# Patient Record
Sex: Female | Born: 1990 | Race: White | Hispanic: No | Marital: Single | Smoking: Current every day smoker
Health system: Southern US, Community
[De-identification: ages and names within clinical notes are randomized; demographics above are authoritative.]

## PROBLEM LIST (undated history)

## (undated) DIAGNOSIS — F419 Anxiety disorder, unspecified: Secondary | ICD-10-CM

## (undated) DIAGNOSIS — F1110 Opioid abuse, uncomplicated: Secondary | ICD-10-CM

## (undated) DIAGNOSIS — F329 Major depressive disorder, single episode, unspecified: Secondary | ICD-10-CM

## (undated) DIAGNOSIS — F32A Depression, unspecified: Secondary | ICD-10-CM

## (undated) HISTORY — PX: NO PAST SURGERIES: SHX2092

---

## 2012-06-03 ENCOUNTER — Emergency Department (HOSPITAL_COMMUNITY)
Admission: EM | Admit: 2012-06-03 | Discharge: 2012-06-03 | Disposition: A | Discharge status: Home / Self Care | Payer: Self-pay | Attending: Emergency Medicine | Admitting: Emergency Medicine

## 2012-06-03 ENCOUNTER — Encounter (HOSPITAL_COMMUNITY): Payer: Self-pay | Admitting: *Deleted

## 2012-06-03 DIAGNOSIS — F3289 Other specified depressive episodes: Secondary | ICD-10-CM | POA: Insufficient documentation

## 2012-06-03 DIAGNOSIS — F1120 Opioid dependence, uncomplicated: Secondary | ICD-10-CM | POA: Insufficient documentation

## 2012-06-03 DIAGNOSIS — F411 Generalized anxiety disorder: Secondary | ICD-10-CM | POA: Insufficient documentation

## 2012-06-03 DIAGNOSIS — F17200 Nicotine dependence, unspecified, uncomplicated: Secondary | ICD-10-CM | POA: Insufficient documentation

## 2012-06-03 DIAGNOSIS — F1910 Other psychoactive substance abuse, uncomplicated: Secondary | ICD-10-CM

## 2012-06-03 DIAGNOSIS — F329 Major depressive disorder, single episode, unspecified: Secondary | ICD-10-CM | POA: Insufficient documentation

## 2012-06-03 HISTORY — DX: Anxiety disorder, unspecified: F41.9

## 2012-06-03 HISTORY — DX: Depression, unspecified: F32.A

## 2012-06-03 HISTORY — DX: Major depressive disorder, single episode, unspecified: F32.9

## 2012-06-03 LAB — BASIC METABOLIC PANEL
BUN: 15 mg/dL (ref 6–23)
Chloride: 104 mEq/L (ref 96–112)
GFR calc Af Amer: >90 mL/min (ref >90)
GFR calc non Af Amer: >90 mL/min (ref >90)
Glucose, Bld: 93 mg/dL (ref 70–99)
Potassium: 4.1 mEq/L (ref 3.5–5.1)
Sodium: 141 mEq/L (ref 135–145)

## 2012-06-03 LAB — PREGNANCY, URINE: Preg Test, Ur: NEGATIVE

## 2012-06-03 LAB — CBC WITH DIFFERENTIAL/PLATELET
Eosinophils Absolute: 0.1 10*3/uL (ref 0.0–0.7)
Hemoglobin: 12 g/dL (ref 12.0–15.0)
Lymphs Abs: 2.4 10*3/uL (ref 0.7–4.0)
Monocytes Relative: 12 % (ref 3–12)
Neutro Abs: 2 10*3/uL (ref 1.7–7.7)
Neutrophils Relative %: 39 % — ABNORMAL LOW (ref 43–77)
Platelets: 166 10*3/uL (ref 150–400)
RBC: 4.33 MIL/uL (ref 3.87–5.11)
WBC: 5.2 10*3/uL (ref 4.0–10.5)

## 2012-06-03 LAB — RAPID URINE DRUG SCREEN, HOSP PERFORMED
Barbiturates: NOT DETECTED
Tetrahydrocannabinol: NOT DETECTED

## 2012-06-03 MED ORDER — PROMETHAZINE HCL 25 MG/ML IJ SOLN
25.0000 mg | Freq: Once | INTRAMUSCULAR | Status: AC
  Administered 2012-06-03: 25 mg via INTRAMUSCULAR
  Filled 2012-06-03: qty 1

## 2012-06-03 MED ORDER — ACETAMINOPHEN 325 MG PO TABS
650.0000 mg | ORAL_TABLET | Freq: Once | ORAL | Status: AC
  Administered 2012-06-03: 650 mg via ORAL
  Filled 2012-06-03: qty 2

## 2012-06-03 MED ORDER — NICOTINE 21 MG/24HR TD PT24
21.0000 mg | MEDICATED_PATCH | Freq: Once | TRANSDERMAL | Status: DC
  Administered 2012-06-03: 21 mg via TRANSDERMAL
  Filled 2012-06-03: qty 1

## 2012-06-03 MED ORDER — LORAZEPAM 1 MG PO TABS
0.5000 mg | ORAL_TABLET | Freq: Once | ORAL | Status: AC
  Administered 2012-06-03: 0.5 mg via ORAL
  Filled 2012-06-03: qty 1

## 2012-06-03 MED ORDER — NICOTINE 14 MG/24HR TD PT24: 14.0000 mg | MEDICATED_PATCH | Freq: Once | TRANSDERMAL | Status: DC

## 2012-06-03 NOTE — ED Notes (Signed)
Pt still in waiting area awaiting placement

## 2012-06-03 NOTE — BH Assessment (Signed)
[facility] Assessment Progress Note      06/03/2012 Arrived at the E.D. After the patient had already been discharged. Received a call stating that patient had not received the authorization from [facility] and if they didn't have it, then she would be returned back to ED. Contacted [facility], spoke with Thayer Ohm and gave clinical information. He authorized 3 days, authorization number [id number]. Contacted Behavior[facility] and faxed the support paperwork showing that authorization was completed for patient.   Shon Baton, MSW, LCSW, LCASA, CSW-G

## 2012-06-03 NOTE — BH Assessment (Signed)
Assessment Note   Kathy Pearson is an 21 y.o. female. PT WAS BROUGHT IN BY MOM WITH BOYFRIEND SEEKING DETOX FROM HEROINE. PT EXPRESSED THAT HE HAS BEEN USING 1 TO 2 GRAMS  OF HEROIN DAILY FOR THE PAST 1 YR. PT SAYS HE HAS LAST USE WAS LAST NIGHT & DOES NOT WANT TO CONTINUE LIVING THIS WAY. PT SAYS SHE WANTS TO GET HER LIFE RIGHT FOR HER CHILDREN. PT DENIES ANY IDEATION AT THIS TIME & IS ABLE TO CONTRACT FOR SAFETY. PT ADMITS TO EXPERIENCING SOME WITHDRAWAL SYMPTOMS. PT HAS BEEN INFORMED THAT SHE & BF COULD NOT GOT TO THE SAME FACILITY FOR TREATMENT & PT UNDERSTANDS TERMS. PT HAS REQUESTED TO GO TO [facility] FOR TX. PT WILL BE REFERRED TO [facility] OR [facility] ONCE CLEARED & IS CURRENTLY PENDING DISPOSITION.  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 Depression   . Anxiety    Axis IV: economic problems, housing problems, occupational problems, other psychosocial or environmental problems and problems related to social environment Axis V: 31-40 impairment in reality testing  Past Medical History:  Past Medical History  Diagnosis Date  . Depression   . Anxiety     History reviewed. No pertinent past surgical history.  Family History: No family history on file.  Social History:  reports that she has been smoking Cigarettes.  She does not have any smokeless tobacco history on file. She reports that she uses illicit drugs. She reports that she does not drink alcohol.  Additional Social History:  Alcohol / Drug Use History of alcohol / drug use?: Yes Substance #1 Name of Substance 1: HEROINE 1 - Age of First Use: 20 1 - Amount (size/oz):  1 - 2 GRAM 1 - Frequency: DAILY 1 - Duration: ON GOING 1 - Last Use / Amount: 06/02/12  CIWA: CIWA-Ar BP: 112/81 mmHg Pulse Rate: 90  COWS: Clinical Opiate Withdrawal Scale (COWS) Resting Pulse Rate: Pulse Rate 80 or below Sweating: Subjective report of chills or  flushing Restlessness: Reports difficulty sitting still, but is able to do so Pupil Size: Pupils pinned or normal size for room light Bone or Joint Aches: Patient reports sever diffuse aching of joints/muscles Runny Nose or Tearing: Not present GI Upset: nausea or loose stool Tremor: Tremor can be felt, but not observed Yawning: Yawning once or twice during assessment Anxiety or Irritability: Patient reports increasing irritability or anxiousness Gooseflesh Skin: Piloerection of skin can be felt or hairs standing up on arms COWS Total Score: 12   Allergies:  Allergies  Allergen Reactions  . Clindamycin/Lincomycin Anaphylaxis  . Ultram (Tramadol) Anaphylaxis    Home Medications:  (Not in a hospital admission)  OB/GYN Status:  Patient's last menstrual period was 05/27/2012.  General Assessment Data Location of Assessment: [facility] Assessment: Yes Living Arrangements: Other (Comment) (HOMELESS) Can pt return to current living arrangement?: Yes Admission Status: Voluntary Is patient capable of signing voluntary admission?: Yes Transfer from: Acute Hospital Referral Source: MD     Risk to self Suicidal Ideation: No Suicidal Intent: No Is patient at risk for suicide?: No Suicidal Plan?: No Access to Means: No What has been your use of drugs/alcohol within the last 12 months?: PT ADMITS TO USING HEROIN & ADMITS USING 1 TO 2 GRAMS DAILY & LAST USE WAS 06/02/12 Previous Attempts/Gestures: No How many times?: 0  Other Self Harm Risks: SELF MEDICATING Triggers for Past Attempts:  Unpredictable Intentional Self Injurious Behavior: None Family Suicide History: Unknown Recent stressful life event(s): Financial Problems Persecutory voices/beliefs?: No Depression: Yes Depression Symptoms: Loss of interest in usual pleasures;Insomnia;Fatigue Substance abuse history and/or treatment for substance abuse?: No Suicide prevention information given to non-admitted patients: Not  applicable  Risk to Others Homicidal Ideation: No Thoughts of Harm to Others: No Current Homicidal Intent: No Current Homicidal Plan: No Access to Homicidal Means: No Identified Victim: NA History of harm to others?: No Assessment of Violence: None Noted Violent Behavior Description: CALM, COOEPRATIVE Does patient have access to weapons?: No Criminal Charges Pending?: No Does patient have a court date: No  Psychosis Hallucinations: None noted Delusions: None noted  Mental Status Report Appear/Hygiene: Disheveled;Body odor;Poor hygiene Eye Contact: Poor Motor Activity: Freedom of movement Speech: Logical/coherent Level of Consciousness: Alert Mood: Depressed;Anhedonia Affect: Anxious;Appropriate to circumstance;Depressed Anxiety Level: None Thought Processes: Coherent;Relevant;Circumstantial Judgement: Impaired Orientation: Person;Place;Time;Situation Obsessive Compulsive Thoughts/Behaviors: None  Cognitive Functioning Concentration: Decreased Memory: Recent Intact;Remote Intact IQ: Average Insight: Poor Impulse Control: Poor Appetite: Poor Weight Loss: 0  Weight Gain: 0  Sleep: No Change Total Hours of Sleep: 2  Vegetative Symptoms: None  ADLScreening [facility] Assessment Services) Patient's cognitive ability adequate to safely complete daily activities?: Yes Patient able to express need for assistance with ADLs?: Yes Independently performs ADLs?: Yes  Abuse/Neglect [facility]) Physical Abuse: Denies Verbal Abuse: Denies Sexual Abuse: Denies  Prior Inpatient Therapy Prior Inpatient Therapy: Yes Prior Therapy Dates: NA Prior Therapy Facilty/Provider(s): NA Reason for Treatment: NA  Prior Outpatient Therapy Prior Outpatient Therapy: No Prior Therapy Dates: NA Prior Therapy Facilty/Provider(s): NA Reason for Treatment: NA  ADL Screening (condition at time of admission) Patient's cognitive ability adequate to safely complete daily activities?: Yes Patient able  to express need for assistance with ADLs?: Yes Independently performs ADLs?: Yes       Abuse/Neglect Assessment (Assessment to be complete while patient is alone) Physical Abuse: Denies Verbal Abuse: Denies Sexual Abuse: Denies Values / Beliefs Cultural Requests During Hospitalization: None Spiritual Requests During Hospitalization: None        Additional Information 1:1 In Past 12 Months?: No CIRT Risk: No Elopement Risk: No Does patient have medical clearance?: Yes     Disposition:  Disposition Disposition of Patient: Inpatient treatment program;Referred to ([facility], [facility]) Type of inpatient treatment program: Adult Patient referred to: [facility]  On Site Evaluation by:   Reviewed with Physician:     Waldron Session 06/03/2012 1:05 PM

## 2012-06-03 NOTE — ED Notes (Signed)
Pt requesting to go smoke.  No smoking policy explained to pt.  Verbalized understanding, nicotine patch offered and given.  nad noted.

## 2012-06-03 NOTE — ED Notes (Signed)
Patient requesting something for body aches and restlessness. EDPA made aware and verbal orders given.

## 2012-06-03 NOTE — ED Provider Notes (Signed)
History     CSN: [id number]  Arrival date & time 06/03/12  0927   First MD Initiated Contact with Patient 06/03/12 0930      Chief Complaint  Patient presents with  . detox     (Consider location/radiation/quality/duration/timing/severity/associated sxs/prior treatment) HPI Comments: Kathy Pearson presents requesting assistance from heroin abuse.  She reports using this drug daily for at least the past year.  She has decided today that she needs to get help.  She has called [facility] in [place] and was told to come here for medical clearance as they do have beds available.  She denies any other substance abuse including etoh.  She reports having nausea at this point in time,  Also feels anxious and has a mild headache.  She denies any other medical problems and denies homicidal or suicidal ideation.  The history is provided by the patient.    Past Medical History  Diagnosis Date  . Depression   . Anxiety     History reviewed. No pertinent past surgical history.  No family history on file.  History  Substance Use Topics  . Smoking status: Current Everyday Smoker    Types: Cigarettes  . Smokeless tobacco: Not on file  . Alcohol Use: No    OB History    Grav Para Term Preterm Abortions TAB SAB Ect Mult Living                  Review of Systems  Constitutional: Negative for fever.  HENT: Negative for congestion, sore throat and neck pain.   Eyes: Negative.   Respiratory: Negative for chest tightness and shortness of breath.   Cardiovascular: Negative for chest pain.  Gastrointestinal: Negative for nausea and abdominal pain.  Genitourinary: Negative.   Musculoskeletal: Negative for joint swelling and arthralgias.  Skin: Negative.  Negative for rash and wound.  Neurological: Negative for dizziness, weakness, light-headedness, numbness and headaches.  Hematological: Negative.   Psychiatric/Behavioral: Negative for suicidal ideas, hallucinations, confusion and agitation. The  patient is nervous/anxious.     Allergies  Clindamycin/lincomycin and Ultram  Home Medications  No current outpatient prescriptions on file.  BP 103/61  Pulse 85  Temp 97.7 F (36.5 C) (Oral)  Resp 18  Ht 5\' 4"  (1.626 m)  Wt 110 lb (49.896 kg)  BMI 18.88 kg/m2  SpO2 100%  LMP 05/27/2012  Physical Exam  Nursing note and vitals reviewed. Constitutional: She is oriented to person, place, and time. She appears well-developed and well-nourished.  HENT:  Head: Normocephalic and atraumatic.  Eyes: Conjunctivae are normal.  Neck: Normal range of motion.  Cardiovascular: Normal rate, regular rhythm, normal heart sounds and intact distal pulses.   Pulmonary/Chest: Effort normal and breath sounds normal. She has no wheezes.  Abdominal: Soft. Bowel sounds are normal. There is no tenderness.  Musculoskeletal: Normal range of motion.  Neurological: She is alert and oriented to person, place, and time.  Skin: Skin is warm and dry.  Psychiatric: Her behavior is normal. Thought content normal. Her mood appears anxious.    ED Course  Procedures (including critical care time)  Labs Reviewed  URINE RAPID DRUG SCREEN (HOSP PERFORMED) - Abnormal; Notable for the following:    Opiates POSITIVE (*)     All other components within normal limits  CBC WITH DIFFERENTIAL - Abnormal; Notable for the following:    Neutrophils Relative 39 (*)     Lymphocytes Relative 47 (*)     All other components within normal limits  PREGNANCY, URINE  BASIC METABOLIC PANEL  ETHANOL   No results found.   1. Substance abuse       MDM  ACT team contacted for assistance locating bed for detox.  Medically screened and stable for discharge to detox.        Burgess Amor, [place] 06/03/12 [phone]

## 2012-06-03 NOTE — ED Notes (Signed)
Here for detox from Heroin.  Reports last used yesterday.  C/o body aches, n/d, chills, sweats, and restlessness.  Denies SI/HI.

## 2012-06-03 NOTE — ED Provider Notes (Signed)
Medical screening examination/treatment/procedure(s) were performed by non-physician practitioner and as supervising physician I was immediately available for consultation/collaboration.   Garima Chronis W Taylorann Tkach, MD 06/03/12 1544 

## 2012-06-03 NOTE — ED Notes (Signed)
Pt wanting something for "restlessness", MD aware. 

## 2012-06-03 NOTE — ED Notes (Signed)
Pt accepted by [facility]. Pt to arrive there at 9pm per Natalia Leatherwood, [facility]. Pt being transported by her mother.

## 2012-06-03 NOTE — ED Notes (Signed)
Pt moved to results pending area, awaiting bed at another facility

## 2013-05-18 ENCOUNTER — Emergency Department (HOSPITAL_COMMUNITY)
Admission: EM | Admit: 2013-05-18 | Discharge: 2013-05-19 | Disposition: A | Discharge status: Home / Self Care | Payer: Medicaid Other | Attending: Emergency Medicine | Admitting: Emergency Medicine

## 2013-05-18 ENCOUNTER — Encounter (HOSPITAL_COMMUNITY): Payer: Self-pay | Admitting: *Deleted

## 2013-05-18 DIAGNOSIS — Z3202 Encounter for pregnancy test, result negative: Secondary | ICD-10-CM | POA: Insufficient documentation

## 2013-05-18 DIAGNOSIS — F19939 Other psychoactive substance use, unspecified with withdrawal, unspecified: Secondary | ICD-10-CM | POA: Insufficient documentation

## 2013-05-18 DIAGNOSIS — F1123 Opioid dependence with withdrawal: Secondary | ICD-10-CM

## 2013-05-18 DIAGNOSIS — F1120 Opioid dependence, uncomplicated: Secondary | ICD-10-CM | POA: Insufficient documentation

## 2013-05-18 DIAGNOSIS — F17200 Nicotine dependence, unspecified, uncomplicated: Secondary | ICD-10-CM | POA: Insufficient documentation

## 2013-05-18 DIAGNOSIS — Z8659 Personal history of other mental and behavioral disorders: Secondary | ICD-10-CM | POA: Insufficient documentation

## 2013-05-18 DIAGNOSIS — N390 Urinary tract infection, site not specified: Secondary | ICD-10-CM

## 2013-05-18 HISTORY — DX: Opioid abuse, uncomplicated: F11.10

## 2013-05-18 LAB — BASIC METABOLIC PANEL
CO2: 31 mEq/L (ref 19–32)
Chloride: 104 mEq/L (ref 96–112)
Glucose, Bld: 86 mg/dL (ref 70–99)
Potassium: 3.7 mEq/L (ref 3.5–5.1)
Sodium: 142 mEq/L (ref 135–145)

## 2013-05-18 LAB — URINALYSIS, ROUTINE W REFLEX MICROSCOPIC
Bilirubin Urine: NEGATIVE
Glucose, UA: NEGATIVE mg/dL
Ketones, ur: NEGATIVE mg/dL
Leukocytes, UA: NEGATIVE
pH: 7 (ref 5.0–8.0)

## 2013-05-18 LAB — CBC WITH DIFFERENTIAL/PLATELET
Hemoglobin: 12.7 g/dL (ref 12.0–15.0)
Lymphocytes Relative: 42 % (ref 12–46)
Lymphs Abs: 2.4 10*3/uL (ref 0.7–4.0)
MCH: 29.3 pg (ref 26.0–34.0)
Monocytes Relative: 14 % — ABNORMAL HIGH (ref 3–12)
Neutrophils Relative %: 40 % — ABNORMAL LOW (ref 43–77)
Platelets: 200 10*3/uL (ref 150–400)
RBC: 4.34 MIL/uL (ref 3.87–5.11)
WBC: 5.8 10*3/uL (ref 4.0–10.5)

## 2013-05-18 LAB — RAPID URINE DRUG SCREEN, HOSP PERFORMED
Amphetamines: NOT DETECTED
Benzodiazepines: NOT DETECTED
Cocaine: POSITIVE — AB
Opiates: POSITIVE — AB
Tetrahydrocannabinol: NOT DETECTED

## 2013-05-18 LAB — ETHANOL: Alcohol, Ethyl (B): <11 mg/dL (ref 0–11)

## 2013-05-18 LAB — URINE MICROSCOPIC-ADD ON

## 2013-05-18 MED ORDER — IBUPROFEN 400 MG PO TABS
600.0000 mg | ORAL_TABLET | Freq: Once | ORAL | Status: AC
  Administered 2013-05-18: 600 mg via ORAL
  Filled 2013-05-18: qty 2

## 2013-05-18 MED ORDER — PROMETHAZINE HCL 12.5 MG PO TABS
25.0000 mg | ORAL_TABLET | Freq: Once | ORAL | Status: AC
  Administered 2013-05-18: 25 mg via ORAL
  Filled 2013-05-18 (×2): qty 1

## 2013-05-18 MED ORDER — CLONIDINE HCL 0.1 MG PO TABS
0.1000 mg | ORAL_TABLET | Freq: Once | ORAL | Status: AC
  Administered 2013-05-18: 0.1 mg via ORAL
  Filled 2013-05-18: qty 1

## 2013-05-18 NOTE — ED Provider Notes (Signed)
History  This chart was scribed for Kathy Skene, MD by Manuela Schwartz, ED scribe. This patient was seen in room APA07/[facility]07 and the patient's care was started at 2239.   CSN: [id number]  Arrival date & time 05/18/13  2239   First MD Initiated Contact with Patient 05/18/13 2310      Chief Complaint  Patient presents with  . V70.1   HPI HPI Comments: Kathy Pearson is a 22 y.o. female who presents to the Emergency Department complaining of withdrawal from using heroin and last used yesterday around noon. She states spoke with [facility] and they now have a bed available and was told to get medical clearance before going there. She states normally uses about 2 g heroin per day. She is a smoker and denies drinking alcohol, or other illicit drugs. She states associated chills/body aches, nausea, difficulty sleeping. She denies abdominal pain, diarrhea. Body pains are diffuse, severe, she hasn't taken anything for them.   Past Medical History  Diagnosis Date  . Depression   . Anxiety   . Heroin abuse     History reviewed. No pertinent past surgical history.  History reviewed. No pertinent family history.  History  Substance Use Topics  . Smoking status: Current Every Day Smoker    Types: Cigarettes  . Smokeless tobacco: Not on file  . Alcohol Use: No    OB History   Grav Para Term Preterm Abortions TAB SAB Ect Mult Living                  Review of Systems At least 10pt or greater review of systems completed and are negative except where specified in the HPI.  Allergies  Clindamycin/lincomycin and Ultram  Home Medications  No current outpatient prescriptions on file.  BP 108/66  Pulse 72  Temp(Src) 97.5 F (36.4 C) (Oral)  Resp 18  Ht 5\' 4"  (1.626 m)  SpO2 100%  LMP 02/28/2013  Physical Exam  Nursing notes reviewed.  Electronic medical record reviewed. VITAL SIGNS:   Filed Vitals:   05/18/13 2242  BP: 108/66  Pulse: 72  Temp: 97.5 F (36.4 C)  TempSrc: Oral   Resp: 18  Height: 5\' 4"  (1.626 m)  SpO2: 100%   CONSTITUTIONAL: Awake, oriented, appears non-toxic, appears uncomfortable HENT: Atraumatic, normocephalic, oral mucosa pink and moist, airway patent. Maxillary edentulous. Nares patent without drainage. External ears normal. EYES: Conjunctiva clear, EOMI, PERRLA NECK: Trachea midline, non-tender, supple CARDIOVASCULAR: Normal heart rate, Normal rhythm, No murmurs, rubs, gallops PULMONARY/CHEST: Clear to auscultation, no rhonchi, wheezes, or rales. Symmetrical breath sounds. Non-tender. ABDOMINAL: Non-distended, soft, non-tender - no rebound or guarding.  BS normal. NEUROLOGIC: Non-focal, moving all four extremities, no gross sensory or motor deficits. EXTREMITIES: No clubbing, cyanosis, or edema SKIN: Warm, Dry, No erythema, No rash. Tattoos.  ED Course  Procedures (including critical care time)  Labs Reviewed  CBC WITH DIFFERENTIAL - Abnormal; Notable for the following:    Neutrophils Relative % 40 (*)    Monocytes Relative 14 (*)    All other components within normal limits  URINE RAPID DRUG SCREEN (HOSP PERFORMED) - Abnormal; Notable for the following:    Opiates POSITIVE (*)    Cocaine POSITIVE (*)    All other components within normal limits  URINALYSIS, ROUTINE W REFLEX MICROSCOPIC - Abnormal; Notable for the following:    APPearance CLOUDY (*)    Hgb urine dipstick SMALL (*)    Urobilinogen, UA 4.0 (*)    Nitrite POSITIVE (*)  All other components within normal limits  URINE MICROSCOPIC-ADD ON - Abnormal; Notable for the following:    Bacteria, UA MANY (*)    Casts GRANULAR CAST (*)    All other components within normal limits  URINE CULTURE  BASIC METABOLIC PANEL  ETHANOL  PREGNANCY, URINE   No results found.   1. Narcotic withdrawal   2. Narcotic addiction       MDM  Kathy Pearson is a 22 y.o. female who is a 2 g a day heroin user also found to be taking cocaine presents for medical clearance secondary to  admission for [facility] house. Patient is medically clear at this time, we'll treat her withdrawal symptoms while she's in the emergency department with clonidine and Phenergan. Otherwise she is medically stable, vital signs are stable within normal limits, besides appearing uncomfortable she appears nontoxic.  Discussed with ACT personnel, she will look for placement in the morning specifically to [facility] house.          Kathy Skene, MD 05/19/13 [phone]

## 2013-05-18 NOTE — ED Notes (Addendum)
Pt states she is withdrawing from heroin, last use was mid day yesterday. Pt having body aches, restless leg syndrome, unable to sleepPt spoke with [facility] and they have a bed available and was to come here and get medical clearance and go straight there.

## 2013-05-19 MED ORDER — PROMETHAZINE HCL 25 MG PO TABS: 25.0000 mg | ORAL_TABLET | Freq: Four times a day (QID) | ORAL | Status: DC | PRN

## 2013-05-19 MED ORDER — ONDANSETRON 8 MG PO TBDP
8.0000 mg | ORAL_TABLET | Freq: Three times a day (TID) | ORAL | Status: DC | PRN
  Administered 2013-05-19: 8 mg via ORAL
  Filled 2013-05-19 (×2): qty 1

## 2013-05-19 MED ORDER — CLONIDINE HCL 0.2 MG PO TABS: 0.1000 mg | ORAL_TABLET | Freq: Two times a day (BID) | ORAL | Status: DC

## 2013-05-19 MED ORDER — CEPHALEXIN 500 MG PO CAPS: 500.0000 mg | ORAL_CAPSULE | Freq: Four times a day (QID) | ORAL | Status: DC

## 2013-05-19 MED ORDER — PROMETHAZINE HCL 12.5 MG PO TABS
25.0000 mg | ORAL_TABLET | Freq: Once | ORAL | Status: AC
  Administered 2013-05-19: 25 mg via ORAL
  Filled 2013-05-19: qty 2

## 2013-05-19 MED ORDER — CEPHALEXIN 500 MG PO CAPS
500.0000 mg | ORAL_CAPSULE | Freq: Four times a day (QID) | ORAL | Status: DC
  Administered 2013-05-19: 500 mg via ORAL
  Filled 2013-05-19: qty 1

## 2013-05-19 MED ORDER — CLONIDINE HCL 0.1 MG PO TABS
0.1000 mg | ORAL_TABLET | Freq: Once | ORAL | Status: AC
  Administered 2013-05-19: 0.1 mg via ORAL
  Filled 2013-05-19: qty 1

## 2013-05-19 MED ORDER — ONDANSETRON 8 MG PO TBDP
8.0000 mg | ORAL_TABLET | Freq: Once | ORAL | Status: AC
  Administered 2013-05-19: 8 mg via ORAL
  Filled 2013-05-19: qty 1

## 2013-05-19 NOTE — ED Notes (Signed)
Patient alert. No distress. Eating breakfast intermittently.

## 2013-05-19 NOTE — ED Notes (Signed)
Patient c/o nausea. MD consulted and verbal order for 4 mg Zofran obtained.

## 2013-05-19 NOTE — ED Notes (Signed)
Patient requesting a doctor to look at her throat. C/o pain to area. MD aware.

## 2013-05-19 NOTE — ED Notes (Signed)
Patient complaining of nausea, asked for emesis bag. Dr. Rulon Abide notified. New orders given.

## 2013-05-19 NOTE — ED Notes (Addendum)
Requesting apple juice. Given. Patient resting in no distress.

## 2013-05-19 NOTE — BH Assessment (Addendum)
Assessment Note   Kathy Pearson is an 22 y.o. female.   Presents voluntarily to the ED for medical clearance in order to go to [facility] for treatment of heroin and sometimes benzodiazapine addiction. She uses about 2 gm of heroin a day with last use on 6/17. She also uses Xanax, up to 10 one-mg tabs a day and last use of that was one month ago. She states that she called [facility] yesterday and that they have a bed pending medical clearance. Spoke with [facility] and they did not remember her name, but asked that I send labs, etc, for consideration. Denies SI/HI, is not psychotic or delusional.  Lives with her mother and is able to return there.  1:28 PM [facility] cannot take pt again because [facility] will not cover her since she was there within this year. Pt is adamant that she go to a facility that treats opiate addiction withdrawal with suboxone. Have not had success finding inpt facility for pt at this time, but can refer her to [facility], OP that does detox people with suboxone.  1:34 PM  Pt has changed her mind and would like to be considered at [facility] because her mother has told her that she has to go somewhere. Will ask [facility] to consider. 2:56 PM  [facility] would not accept pt because she lacked the acuity required for admission. Pt to be discharged home with referral to [facility] who uses suboxone; Freight forwarder and recommended to call Narcotics Anonymous for meetings and the [employer] for information about meetings. Will be discharged home with medications to help support withdrawal symptoms.  .  Axis I: Substance Abuse Axis II: Deferred Axis III:  Past Medical History  Diagnosis Date  . Depression   . Anxiety   . Heroin abuse    Axis IV: economic problems, other psychosocial or environmental problems, problems related to legal system/crime, problems related to social environment and problems with access to health care services Axis V: 41-50 serious symptoms  Past Medical  History:  Past Medical History  Diagnosis Date  . Depression   . Anxiety   . Heroin abuse     History reviewed. No pertinent past surgical history.  Family History: History reviewed. No pertinent family history.  Social History:  reports that she has been smoking Cigarettes.  She has been smoking about 0.00 packs per day. She does not have any smokeless tobacco history on file. She reports that she uses illicit drugs. She reports that she does not drink alcohol.  Additional Social History:  Alcohol / Drug Use History of alcohol / drug use?: Yes Negative Consequences of Use: Financial;Legal;Personal relationships Withdrawal Symptoms: Nausea / Vomiting;Weakness;Irritability Substance #1 Name of Substance 1: Xanax 1 - Age of First Use: 18 1 - Amount (size/oz): 1 mg - uses up to 10 a day 1 - Frequency: whenever available 1 - Duration: 4 years 1 - Last Use / Amount: 1 month ago Substance #2 Name of Substance 2: Heroin 2 - Age of First Use: 19 2 - Amount (size/oz): 2 gm 2 - Frequency: daily 2 - Duration: 3 years 2 - Last Use / Amount: 05/16/2013  CIWA: CIWA-Ar BP: 99/51 mmHg Pulse Rate: 53 COWS: Clinical Opiate Withdrawal Scale (COWS) Resting Pulse Rate: Pulse Rate 80 or below Sweating: Subjective report of chills or flushing Restlessness: Reports difficulty sitting still, but is able to do so Pupil Size: Pupils possibly larger than normal for room light Bone or Joint Aches: Patient reports  sever diffuse aching of joints/muscles Runny Nose or Tearing: Nasal stuffiness or unusually moist eyes GI Upset: Vomiting or diarrhea Tremor: Tremor can be felt, but not observed Yawning: No yawning Anxiety or Irritability: Patient reports increasing irritability or anxiousness Gooseflesh Skin: Skin is smooth COWS Total Score: 11  Allergies:  Allergies  Allergen Reactions  . Clindamycin/Lincomycin Anaphylaxis  . Ultram (Tramadol) Anaphylaxis    Home Medications:  (Not in a  hospital admission)  OB/GYN Status:  Patient's last menstrual period was 02/28/2013.  General Assessment Data Location of Assessment: [facility] ACT Assessment: Yes Living Arrangements: Parent Can pt return to current living arrangement?: Yes Admission Status: Voluntary Is patient capable of signing voluntary admission?: Yes Transfer from: Home Referral Source: MD  Education Status Is patient currently in school?: No  Risk to self Suicidal Ideation: No Suicidal Intent: No Is patient at risk for suicide?: No Suicidal Plan?: No Access to Means: No Previous Attempts/Gestures: No Intentional Self Injurious Behavior: None Family Suicide History: No Recent stressful life event(s): Other (Comment) (addiction and has court date) Persecutory voices/beliefs?: No Depression: Yes Depression Symptoms: Despondent;Fatigue;Loss of interest in usual pleasures Substance abuse history and/or treatment for substance abuse?: Yes (heroin IV is drug of choice - also benzos) Suicide prevention information given to non-admitted patients: Not applicable  Risk to Others Homicidal Ideation: No Thoughts of Harm to Others: No Current Homicidal Intent: No Current Homicidal Plan: No Access to Homicidal Means: No History of harm to others?: No Assessment of Violence: None Noted Criminal Charges Pending?: Yes Describe Pending Criminal Charges: she does not remember Does patient have a court date: Yes Court Date:  (unknown)  Psychosis Hallucinations: None noted Delusions: None noted  Mental Status Report Appear/Hygiene: Disheveled Eye Contact: Good Motor Activity: Freedom of movement Speech: Logical/coherent Level of Consciousness: Alert Mood: Depressed Affect: Blunted Anxiety Level: Minimal Thought Processes: Coherent;Relevant Judgement: Unimpaired Orientation: Person;Place;Time;Situation Obsessive Compulsive Thoughts/Behaviors: Minimal  Cognitive Functioning Concentration:  Decreased Memory: Recent Intact;Remote Intact IQ: Average Insight: Fair Impulse Control: Poor Appetite: Fair Sleep: No Change Total Hours of Sleep: 10 Vegetative Symptoms: Staying in bed  ADLScreening [facility] Assessment Services) Patient's cognitive ability adequate to safely complete daily activities?: Yes Patient able to express need for assistance with ADLs?: Yes Independently performs ADLs?: Yes (appropriate for developmental age)  Abuse/Neglect [facility]) Physical Abuse: Denies Verbal Abuse: Denies Sexual Abuse: Denies  Prior Inpatient Therapy Prior Inpatient Therapy: Yes Prior Therapy Dates: 7/13 Prior Therapy Facilty/Provider(s): [facility] Reason for Treatment: substance dependence     ADL Screening (condition at time of admission) Patient's cognitive ability adequate to safely complete daily activities?: Yes Patient able to express need for assistance with ADLs?: Yes Independently performs ADLs?: Yes (appropriate for developmental age)       Abuse/Neglect Assessment (Assessment to be complete while patient is alone) Physical Abuse: Denies Verbal Abuse: Denies Sexual Abuse: Denies Values / Beliefs Cultural Requests During Hospitalization: None Spiritual Requests During Hospitalization: None     Nutrition Screen- [facility] Adult/[facility] Patient's home diet: Regular  Additional Information 1:1 In Past 12 Months?: No CIRT Risk: No Elopement Risk: No Does patient have medical clearance?: Yes     Disposition:  Disposition Initial Assessment Completed for this Encounter: Yes Disposition of Patient: Inpatient treatment program Type of inpatient treatment program: Adult  On Site Evaluation by:   Reviewed with Physician:     Genia Del 05/19/2013 10:40 AM

## 2013-05-19 NOTE — ED Notes (Signed)
Patient states that she wants something to sleep. Dr. Rulon Abide notified. No new orders given at this time. Explained to patient that she would need to be awake when ACT team arrived in a few hours.

## 2013-05-19 NOTE — ED Notes (Signed)
Lunch tray provided. Requesting to see Diane, from ACT team.

## 2013-05-19 NOTE — ED Notes (Signed)
Went back in room to check on patient and recheck vital signs prior to medicating; patient sleeping at this time. Dr. Rulon Abide aware. Will hold meds at this time.

## 2013-05-19 NOTE — ED Provider Notes (Addendum)
Patient with complaint of sore throat.  Results for orders placed during the hospital encounter of 05/18/13  CBC WITH DIFFERENTIAL      Result Value Range   WBC 5.8  4.0 - 10.5 K/uL   RBC 4.34  3.87 - 5.11 MIL/uL   Hemoglobin 12.7  12.0 - 15.0 g/dL   HCT 16.1  09.6 - 04.5 %   MCV 88.9  78.0 - 100.0 fL   MCH 29.3  26.0 - 34.0 pg   MCHC 32.9  30.0 - 36.0 g/dL   RDW 40.9  81.1 - 91.4 %   Platelets 200  150 - 400 K/uL   Neutrophils Relative % 40 (*) 43 - 77 %   Neutro Abs 2.3  1.7 - 7.7 K/uL   Lymphocytes Relative 42  12 - 46 %   Lymphs Abs 2.4  0.7 - 4.0 K/uL   Monocytes Relative 14 (*) 3 - 12 %   Monocytes Absolute 0.8  0.1 - 1.0 K/uL   Eosinophils Relative 3  0 - 5 %   Eosinophils Absolute 0.2  0.0 - 0.7 K/uL   Basophils Relative 1  0 - 1 %   Basophils Absolute 0.1  0.0 - 0.1 K/uL   WBC Morphology ATYPICAL LYMPHOCYTES    BASIC METABOLIC PANEL      Result Value Range   Sodium 142  135 - 145 mEq/L   Potassium 3.7  3.5 - 5.1 mEq/L   Chloride 104  96 - 112 mEq/L   CO2 31  19 - 32 mEq/L   Glucose, Bld 86  70 - 99 mg/dL   BUN 10  6 - 23 mg/dL   Creatinine, Ser 7.82  0.50 - 1.10 mg/dL   Calcium 9.2  8.4 - 95.6 mg/dL   GFR calc non Af Amer >90  >90 mL/min   GFR calc Af Amer >90  >90 mL/min  ETHANOL      Result Value Range   Alcohol, Ethyl (B) <11  0 - 11 mg/dL  URINE RAPID DRUG SCREEN (HOSP PERFORMED)      Result Value Range   Opiates POSITIVE (*) NONE DETECTED   Cocaine POSITIVE (*) NONE DETECTED   Benzodiazepines NONE DETECTED  NONE DETECTED   Amphetamines NONE DETECTED  NONE DETECTED   Tetrahydrocannabinol NONE DETECTED  NONE DETECTED   Barbiturates NONE DETECTED  NONE DETECTED  PREGNANCY, URINE      Result Value Range   Preg Test, Ur NEGATIVE  NEGATIVE  URINALYSIS, ROUTINE W REFLEX MICROSCOPIC      Result Value Range   Color, Urine YELLOW  YELLOW   APPearance CLOUDY (*) CLEAR   Specific Gravity, Urine 1.015  1.005 - 1.030   pH 7.0  5.0 - 8.0   Glucose, UA NEGATIVE   NEGATIVE mg/dL   Hgb urine dipstick SMALL (*) NEGATIVE   Bilirubin Urine NEGATIVE  NEGATIVE   Ketones, ur NEGATIVE  NEGATIVE mg/dL   Protein, ur NEGATIVE  NEGATIVE mg/dL   Urobilinogen, UA 4.0 (*) 0.0 - 1.0 mg/dL   Nitrite POSITIVE (*) NEGATIVE   Leukocytes, UA NEGATIVE  NEGATIVE  URINE MICROSCOPIC-ADD ON      Result Value Range   Squamous Epithelial / LPF RARE  RARE   WBC, UA 3-6  <3 WBC/hpf   RBC / HPF 0-2  <3 RBC/hpf   Bacteria, UA MANY (*) RARE   Casts GRANULAR CAST (*) NEGATIVE    Review of labs are suggestive of urinary tract infection due  to a positive nitrite. We'll treat with an antibiotic. Will examine the patient's throat. We'll do rapid strep test.  Shelda Jakes, MD 05/19/13 1224  Patient evaluated by behavioral health team. Patient is not a candidate for detox at [facility]. Patient is not a candidate at [facility] because she's been seen there before. Patient clinically is not in significant withdrawal can be discharged home with Phenergan clonidine and will be treated with Keflex for the urinary tract infection. We have opted not to do the rapid strep test. But will treat instead with Keflex which will take care of strep pharyngitis.      Shelda Jakes, MD 05/19/13 [phone]

## 2013-05-20 LAB — URINE CULTURE: Colony Count: >=100000

## 2013-05-21 ENCOUNTER — Telehealth (HOSPITAL_COMMUNITY): Payer: Self-pay | Admitting: Emergency Medicine

## 2013-05-21 LAB — CULTURE, GROUP A STREP

## 2013-05-21 NOTE — ED Notes (Signed)
Post ED Visit - Positive Culture Follow-up  Culture report reviewed by antimicrobial stewardship pharmacist: []  Wes Dulaney, Pharm.D., BCPS []  Celedonio Miyamoto, Pharm.D., BCPS []  Georgina Pillion, [address].D., BCPS []  [place], [address].D., BCPS, AAHIVP []  Estella Husk, Pharm.D., BCPS, AAHIVP [x]  Laurence Slate, [address].D., BCPS  Positive urine culture Treated with Keflex, organism sensitive to the same and no further patient follow-up is required at this time.  Kylie A Holland 05/21/2013, 11:21 AM

## 2015-01-14 ENCOUNTER — Emergency Department (HOSPITAL_COMMUNITY)
Admission: EM | Admit: 2015-01-14 | Discharge: 2015-01-14 | Disposition: A | Discharge status: Home / Self Care | Payer: Medicaid Other | Attending: Emergency Medicine | Admitting: Emergency Medicine

## 2015-01-14 ENCOUNTER — Emergency Department (HOSPITAL_COMMUNITY): Payer: Medicaid Other

## 2015-01-14 ENCOUNTER — Encounter (HOSPITAL_COMMUNITY): Payer: Self-pay | Admitting: *Deleted

## 2015-01-14 DIAGNOSIS — Y998 Other external cause status: Secondary | ICD-10-CM | POA: Insufficient documentation

## 2015-01-14 DIAGNOSIS — W000XXA Fall on same level due to ice and snow, initial encounter: Secondary | ICD-10-CM | POA: Insufficient documentation

## 2015-01-14 DIAGNOSIS — Z792 Long term (current) use of antibiotics: Secondary | ICD-10-CM | POA: Insufficient documentation

## 2015-01-14 DIAGNOSIS — S52122A Displaced fracture of head of left radius, initial encounter for closed fracture: Secondary | ICD-10-CM | POA: Insufficient documentation

## 2015-01-14 DIAGNOSIS — F329 Major depressive disorder, single episode, unspecified: Secondary | ICD-10-CM | POA: Insufficient documentation

## 2015-01-14 DIAGNOSIS — F419 Anxiety disorder, unspecified: Secondary | ICD-10-CM | POA: Insufficient documentation

## 2015-01-14 DIAGNOSIS — Z79899 Other long term (current) drug therapy: Secondary | ICD-10-CM | POA: Insufficient documentation

## 2015-01-14 DIAGNOSIS — Y9289 Other specified places as the place of occurrence of the external cause: Secondary | ICD-10-CM | POA: Insufficient documentation

## 2015-01-14 DIAGNOSIS — Y9389 Activity, other specified: Secondary | ICD-10-CM | POA: Insufficient documentation

## 2015-01-14 MED ORDER — OXYCODONE-ACETAMINOPHEN 5-325 MG PO TABS: 1.0000 | ORAL_TABLET | ORAL | Status: DC | PRN

## 2015-01-14 MED ORDER — OXYCODONE-ACETAMINOPHEN 5-325 MG PO TABS
2.0000 | ORAL_TABLET | Freq: Once | ORAL | Status: AC
  Administered 2015-01-14: 2 via ORAL
  Filled 2015-01-14: qty 2

## 2015-01-14 NOTE — ED Notes (Addendum)
Fall 3 am on ice,, pain lt arm No LOC , alert, neck hurts.  Good radial pulse

## 2015-01-14 NOTE — Discharge Instructions (Signed)
Elbow Fracture, Simple °A fracture is a break in one of the bones.When fractures are not displaced or separated, they may be treated with only a sling or splint. The sling or splint may only be required for two to three weeks. In these cases, often the elbow is put through early range of motion exercises to prevent the elbow from getting stiff. °DIAGNOSIS  °The diagnosis (learning what is wrong) of a fractured elbow is made by x-ray. These may be required before and after the elbow is put into a splint or cast. X-rays are taken after to make sure the bone pieces have not moved. °HOME CARE INSTRUCTIONS  °· Only take over-the-counter or prescription medicines for pain, discomfort, or fever as directed by your caregiver. °· If you have a splint held on with an elastic wrap and your hand or fingers become numb or cold and blue, loosen the wrap and reapply more loosely. See your caregiver if there is no relief. °· You may use ice for twenty minutes, four times per day, for the first two to three days. °· Use your elbow as directed. °· See your caregiver as directed. It is very important to keep all follow-up referrals and appointments in order to avoid any long-term problems with your elbow including chronic pain or stiffness. °SEEK IMMEDIATE MEDICAL CARE IF:  °· There is swelling or increasing pain in elbow. °· You begin to lose feeling or experience numbness or tingling in your hand or fingers. °· You develop swelling of the hand and fingers. °· You get a cold or blue hand or fingers on affected side. °MAKE SURE YOU:  °· Understand these instructions. °· Will watch your condition. °· Will get help right away if you are not doing well or get worse. °Document Released: 11/10/2001 Document Revised: 02/08/2012 Document Reviewed: 10/01/2009 °ExitCare® Patient Information ©2015 [employer]. This information is not intended to replace advice given to you by your health care provider. Make sure you discuss any questions you  have with your health care provider. ° °Cast or Splint Care °Casts and splints support injured limbs and keep bones from moving while they heal. It is important to care for your cast or splint at home.   °HOME CARE INSTRUCTIONS °· Keep the cast or splint uncovered during the drying period. It can take 24 to 48 hours to dry if it is made of plaster. A fiberglass cast will dry in less than 1 hour. °· Do not rest the cast on anything harder than a pillow for the first 24 hours. °· Do not put weight on your injured limb or apply pressure to the cast until your health care provider gives you permission. °· Keep the cast or splint dry. Wet casts or splints can lose their shape and may not support the limb as well. A wet cast that has lost its shape can also create harmful pressure on your skin when it dries. Also, wet skin can become infected. °¨ Cover the cast or splint with a plastic bag when bathing or when out in the rain or snow. If the cast is on the trunk of the body, take sponge baths until the cast is removed. °¨ If your cast does become wet, dry it with a towel or a blow dryer on the cool setting only. °· Keep your cast or splint clean. Soiled casts may be wiped with a moistened cloth. °· Do not place any hard or soft foreign objects under your cast or splint, such   as cotton, toilet paper, lotion, or powder. °· Do not try to scratch the skin under the cast with any object. The object could get stuck inside the cast. Also, scratching could lead to an infection. If itching is a problem, use a blow dryer on a cool setting to relieve discomfort. °· Do not trim or cut your cast or remove padding from inside of it. °· Exercise all joints next to the injury that are not immobilized by the cast or splint. For example, if you have a long leg cast, exercise the hip joint and toes. If you have an arm cast or splint, exercise the shoulder, elbow, thumb, and fingers. °· Elevate your injured arm or leg on 1 or 2 pillows for  the first 1 to 3 days to decrease swelling and pain. It is best if you can comfortably elevate your cast so it is higher than your heart. °SEEK MEDICAL CARE IF:  °· Your cast or splint cracks. °· Your cast or splint is too tight or too loose. °· You have unbearable itching inside the cast. °· Your cast becomes wet or develops a soft spot or area. °· You have a bad smell coming from inside your cast. °· You get an object stuck under your cast. °· Your skin around the cast becomes red or raw. °· You have new pain or worsening pain after the cast has been applied. °SEEK IMMEDIATE MEDICAL CARE IF:  °· You have fluid leaking through the cast. °· You are unable to move your fingers or toes. °· You have discolored (blue or white), cool, painful, or very swollen fingers or toes beyond the cast. °· You have tingling or numbness around the injured area. °· You have severe pain or pressure under the cast. °· You have any difficulty with your breathing or have shortness of breath. °· You have chest pain. °Document Released: 11/13/2000 Document Revised: 09/06/2013 Document Reviewed: 05/25/2013 °ExitCare® Patient Information ©2015 [employer]. This information is not intended to replace advice given to you by your health care provider. Make sure you discuss any questions you have with your health care provider. ° °

## 2015-01-14 NOTE — ED Notes (Signed)
Holding pt until splinting material hardens, will re-check CMS prior to pt leaving the department.

## 2015-01-14 NOTE — ED Provider Notes (Addendum)
CSN: [id number]     Arrival date & time 01/14/15  1404 History   First MD Initiated Contact with Patient 01/14/15 1413     Chief Complaint  Patient presents with  . Fall     (Consider location/radiation/quality/duration/timing/severity/associated sxs/prior Treatment) HPI Comments: Patient presents to the ER for evaluation of left arm injury. Patient reports that she fell at 3 AM last night. She is complaining of pain from the left shoulder all the way down the arm. No loss of consciousness.  Patient is a 24 y.o. female presenting with fall.  Fall    Past Medical History  Diagnosis Date  . Depression   . Anxiety   . Heroin abuse    History reviewed. No pertinent past surgical history. History reviewed. No pertinent family history. History  Substance Use Topics  . Smoking status: Current Every Day Smoker    Types: Cigarettes  . Smokeless tobacco: Not on file  . Alcohol Use: No   OB History    No data available     Review of Systems  Musculoskeletal: Positive for arthralgias.  All other systems reviewed and are negative.     Allergies  Clindamycin/lincomycin and Ultram  Home Medications   Prior to Admission medications   Medication Sig Start Date End Date Taking? Authorizing Provider  cephALEXin (KEFLEX) 500 MG capsule Take 1 capsule (500 mg total) by mouth 4 (four) times daily. 05/19/13   Vanetta Mulders, MD  cloNIDine (CATAPRES) 0.2 MG tablet Take 0.5 tablets (0.1 mg total) by mouth 2 (two) times daily. 05/19/13   Vanetta Mulders, MD  oxyCODONE-acetaminophen (PERCOCET) 5-325 MG per tablet Take 1 tablet by mouth every 4 (four) hours as needed. 01/14/15   Gilda Crease, MD  promethazine (PHENERGAN) 25 MG tablet Take 1 tablet (25 mg total) by mouth every 6 (six) hours as needed for nausea. 05/19/13   Vanetta Mulders, MD   BP 103/68 mmHg  Pulse 114  Temp(Src) 98.9 F (37.2 C) (Oral)  Resp 16  Ht  (1.626 m)  Wt 137 lb (62.143 kg)  BMI 23.50 kg/m2   SpO2 100%  LMP 01/07/2015 Physical Exam  Constitutional: She is oriented to person, place, and time. She appears well-developed and well-nourished. No distress.  HENT:  Head: Normocephalic and atraumatic.  Right Ear: Hearing normal.  Left Ear: Hearing normal.  Nose: Nose normal.  Mouth/Throat: Oropharynx is clear and moist and mucous membranes are normal.  Eyes: Conjunctivae and EOM are normal. Pupils are equal, round, and reactive to light.  Neck: Normal range of motion. Neck supple. No spinous process tenderness and no muscular tenderness present.  Cardiovascular: Regular rhythm, S1 normal and S2 normal.  Exam reveals no gallop and no friction rub.   No murmur heard. Pulmonary/Chest: Effort normal and breath sounds normal. No respiratory distress. She exhibits no tenderness.  Abdominal: Soft. Normal appearance and bowel sounds are normal. There is no hepatosplenomegaly. There is no tenderness. There is no rebound, no guarding, no tenderness at McBurney's point and negative Murphy's sign. No hernia.  Musculoskeletal: Normal range of motion.       Left shoulder: She exhibits tenderness. She exhibits no deformity.       Left elbow: She exhibits no effusion and no deformity. Tenderness found.       Left wrist: She exhibits tenderness. She exhibits no deformity.  Neurological: She is alert and oriented to person, place, and time. She has normal strength. No cranial nerve deficit or sensory deficit. Coordination  normal. GCS eye subscore is 4. GCS verbal subscore is 5. GCS motor subscore is 6.  Skin: Skin is warm, dry and intact. No rash noted. No cyanosis.  Psychiatric: She has a normal mood and affect. Her speech is normal and behavior is normal. Thought content normal.  Nursing note and vitals reviewed.   ED Course  Procedures (including critical care time) Labs Review Labs Reviewed - No data to display  Imaging Review Dg Elbow Complete Left  01/14/2015   CLINICAL DATA:  Fall.  Initial  encounter.  Elbow pain.  EXAM: LEFT ELBOW - COMPLETE 3+ VIEW  COMPARISON:  None.  FINDINGS: Large elbow effusion is present. There is an obliquely oriented fracture of the radial head. Mild step-off deformity is present, best seen on the lateral view. The step-off deformity is about 1 mm. No distraction of the radial head components. Ulnohumeral joint appears located. The alignment of the elbow a remains anatomic.  IMPRESSION: Minimally displaced radial head fracture.   Electronically Signed   By: Andreas NewportGeoffrey  Lamke M.D.   On: 01/14/2015 15:08   Dg Wrist Complete Left  01/14/2015   CLINICAL DATA:  Larey SeatFell approximately 12 hr ago on ice outside. Left wrist pain. Initial encounter.  EXAM: LEFT WRIST - COMPLETE 3+ VIEW  COMPARISON:  None.  FINDINGS: No evidence of acute fracture or dislocation. Joint spaces well preserved. Well-preserved bone mineral density. No intrinsic osseous abnormalities.  IMPRESSION: Normal examination.   Electronically Signed   By: Hulan Saashomas  Lawrence M.D.   On: 01/14/2015 15:09   Dg Shoulder Left  01/14/2015   CLINICAL DATA:  Status post fall at 3 a.m. today on ice with pain in the left arm ; positioning was difficult due to pain.  EXAM: LEFT SHOULDER - 2+ VIEW  COMPARISON:  None.  FINDINGS: A straight AP view of the humeral head could not be obtained. The bones of the left shoulder are adequately mineralized. No acute fracture is demonstrated. The joint spaces are reasonably well maintained. The left clavicle, left scapula, and observed portions of the left ribs are normal.  IMPRESSION: No acute fracture or other acute bony abnormality of the left shoulder is demonstrated.   Electronically Signed   By: David  SwazilandJordan   On: 01/14/2015 15:10     EKG Interpretation None      MDM   Final diagnoses:  Radial head fracture, closed, left, initial encounter    Patient presents to the ER for evaluation of left arm injury after a fall. Patient complaining of pain of the entire arm. She had  tenderness without deformity of left elbow, wrist and shoulder. No obvious deformity noted. No other evidence of significant injury. X-rays of the regions reveal radial head fracture. Patient placed a splint, provided analgesia, follow up with orthopedics.  Patient examined after splint was applied. Patient has normal distal blood flow, normal neurovascular function. Cast has been appropriately applied.  Gilda Creasehristopher J. Pollina, MD 01/14/15 1524  Gilda Creasehristopher J. Pollina, MD 01/14/15 [phone]

## 2015-01-14 NOTE — ED Notes (Signed)
Pt alert & oriented x4, stable gait. Patient given discharge instructions, paperwork & prescription(s). Patient  instructed to stop at the registration desk to finish any additional paperwork. Patient verbalized understanding. Pt left department w/ no further questions. 

## 2016-01-19 ENCOUNTER — Encounter (HOSPITAL_COMMUNITY): Payer: Self-pay | Admitting: *Deleted

## 2016-01-19 ENCOUNTER — Emergency Department (HOSPITAL_COMMUNITY): Payer: Self-pay

## 2016-01-19 ENCOUNTER — Emergency Department (HOSPITAL_COMMUNITY)
Admission: EM | Admit: 2016-01-19 | Discharge: 2016-01-20 | Disposition: A | Discharge status: Home / Self Care | Payer: Self-pay | Attending: Emergency Medicine | Admitting: Emergency Medicine

## 2016-01-19 DIAGNOSIS — G43909 Migraine, unspecified, not intractable, without status migrainosus: Secondary | ICD-10-CM | POA: Insufficient documentation

## 2016-01-19 DIAGNOSIS — F17210 Nicotine dependence, cigarettes, uncomplicated: Secondary | ICD-10-CM | POA: Insufficient documentation

## 2016-01-19 DIAGNOSIS — G43009 Migraine without aura, not intractable, without status migrainosus: Secondary | ICD-10-CM

## 2016-01-19 DIAGNOSIS — F419 Anxiety disorder, unspecified: Secondary | ICD-10-CM | POA: Insufficient documentation

## 2016-01-19 DIAGNOSIS — Z8781 Personal history of (healed) traumatic fracture: Secondary | ICD-10-CM | POA: Insufficient documentation

## 2016-01-19 DIAGNOSIS — M25522 Pain in left elbow: Secondary | ICD-10-CM | POA: Insufficient documentation

## 2016-01-19 MED ORDER — KETOROLAC TROMETHAMINE 30 MG/ML IJ SOLN
30.0000 mg | Freq: Once | INTRAMUSCULAR | Status: AC
  Administered 2016-01-20: 30 mg via INTRAVENOUS
  Filled 2016-01-19: qty 1

## 2016-01-19 MED ORDER — METOCLOPRAMIDE HCL 5 MG/ML IJ SOLN
10.0000 mg | Freq: Once | INTRAMUSCULAR | Status: AC
  Administered 2016-01-20: 10 mg via INTRAVENOUS
  Filled 2016-01-19: qty 2

## 2016-01-19 MED ORDER — SODIUM CHLORIDE 0.9 % IV BOLUS (SEPSIS)
1000.0000 mL | Freq: Once | INTRAVENOUS | Status: AC
  Administered 2016-01-20: 1000 mL via INTRAVENOUS

## 2016-01-19 MED ORDER — DIPHENHYDRAMINE HCL 50 MG/ML IJ SOLN
25.0000 mg | Freq: Once | INTRAMUSCULAR | Status: AC
  Administered 2016-01-20: 25 mg via INTRAVENOUS
  Filled 2016-01-19: qty 1

## 2016-01-19 NOTE — ED Provider Notes (Signed)
By signing my name below, I, Budd Palmer, attest that this documentation has been prepared under the direction and in the presence of [employer], DO. Electronically Signed: Budd Palmer, ED Scribe. 01/19/2016. 11:56 PM.  TIME SEEN: 11:29 PM  CHIEF COMPLAINT: Left Elbow Pain  HPI: Kathy Pearson is a 25 y.o. female smoker with h/o heroin abuse, migraines who presents to the Emergency Department complaining of constant, aching, worsening  left elbow pain onset 2 days ago. Pt states that she felt a "pop" in the elbow while lifting her niece, and has been having worsening, constant pain in the joint since then. She notes she broke the left elbow 1 year ago, but never went to an orthopedist for f/u due to lack of insurance. She states that since then she is unable to fully extend the left arm. She notes that she "re-fractured" the left arm a few months after the initial injury but she never went to the doctor to have this evaluated either, and that the arm has been chronically painful (aching) ever since. Pt is right-handed. Per records patient had a radial head fracture 01/14/2015 and was placed in a sling from the emergency department.  She also endorses HA, photophobia, and nausea onset en route to the ED. She reports a PMHx of migraines. States this feels similar. No head injury recently. Pt denies numbness, tingling, focal weakness, fever, and chills.   ROS: See HPI Constitutional: no fever  Eyes: no drainage  ENT: no runny nose   Cardiovascular:  no chest pain  Resp: no SOB  GI: no vomiting GU: no dysuria Integumentary: no rash  Allergy: no hives  Musculoskeletal: no leg swelling  Neurological: no slurred speech ROS otherwise negative  PAST MEDICAL HISTORY/PAST SURGICAL HISTORY:  Past Medical History  Diagnosis Date  . Depression   . Anxiety   . Heroin abuse     MEDICATIONS:  Prior to Admission medications   Medication Sig Start Date End Date Taking? Authorizing Provider   cloNIDine (CATAPRES) 0.2 MG tablet Take 0.5 tablets (0.1 mg total) by mouth 2 (two) times daily. Patient not taking: Reported on 01/14/2015 05/19/13   Vanetta Mulders, MD  oxyCODONE-acetaminophen (PERCOCET) 5-325 MG per tablet Take 1 tablet by mouth every 4 (four) hours as needed. 01/14/15   Gilda Crease, MD  promethazine (PHENERGAN) 25 MG tablet Take 1 tablet (25 mg total) by mouth every 6 (six) hours as needed for nausea. Patient not taking: Reported on 01/14/2015 05/19/13   Vanetta Mulders, MD    ALLERGIES:  Allergies  Allergen Reactions  . Clindamycin/Lincomycin Anaphylaxis  . Ultram [Tramadol] Anaphylaxis    SOCIAL HISTORY:  Social History  Substance Use Topics  . Smoking status: Current Every Day Smoker    Types: Cigarettes  . Smokeless tobacco: Not on file  . Alcohol Use: No    FAMILY HISTORY: No family history on file.  EXAM: Pulse 107  Temp(Src) 98.1 F (36.7 C) (Oral)  Resp 20  Ht  (1.651 m)  Wt 150 lb (68.04 kg)  BMI 24.96 kg/m2  SpO2 100%  LMP 01/12/2016 CONSTITUTIONAL: Alert and oriented and responds appropriately to questions. Well-appearing; well-nourished; afebrile and nontoxic-appearing HEAD: Normocephalic EYES: Conjunctivae clear, PERRL; patient has photophobia ENT: normal nose; no rhinorrhea; moist mucous membranes; pharynx without lesions noted; photophobia NECK: Supple, no meningismus, no LAD; no nuchal rigidity CARD: RRR; S1 and S2 appreciated; no murmurs, no clicks, no rubs, no gallops RESP: Normal chest excursion without splinting or tachypnea;  breath sounds clear and equal bilaterally; no wheezes, no rhonchi, no rales, no hypoxia or respiratory distress, speaking full sentences ABD/GI: Normal bowel sounds; non-distended; soft, non-tender, no rebound, no guarding, no peritoneal signs BACK:  The back appears normal and is non-tender to palpation, there is no CVA tenderness EXT: TTP over left elbow without bony deformity, swelling, joint  effusion, erythema, or warmth; chronic decreased flexion in the L elbow, 2+ radial pulse on the left side and normal L grip strength; Otherwise other extremities have normal ROM in all joints; non-tender to palpation; no edema; normal capillary refill; no cyanosis, no calf tenderness or swelling    SKIN: Normal color for age and race; warm NEURO: Moves all extremities equally, sensation to light touch intact diffusely, cranial nerves II through XII intact PSYCH: The patient's mood and manner are appropriate. Grooming and personal hygiene are appropriate.  MEDICAL DECISION MAKING: Patient here with left elbow pain. Has a history of a previous left radial head fracture. No new injury. Neurovascular intact distally. No signs of septic arthritis or gout. We'll repeat an x-ray today. Patient also complaining of migraine headache. This was similar to her prior migraines. Neurologically intact. No fever, meningismus. Doubt this is an intracranial hemorrhage or infectious in nature. Will treat symptomatically with Toradol, Reglan, Benadryl, IV fluids and reassess.  ED PROGRESS: Patient's left elbow x-ray shows a healing previous radial head fracture with no acute abnormality. There is also a 6 mm foreign body in the dorsal aspect of the proximal forearm likely related to her previous history of heroin abuse. There is no sign of infection over this area or tenderness. This is not where she is having pain.  She reports her headache is on is completely gone after above medications. Discussed with her recommend rest, elevation and ice of her left elbow. She may use a sling for comfort which she has at home but have encouraged her to move her arm regularly. Recommended anti-inflammatories for pain control and close outpatient orthopedic follow-up in one week if symptoms do not improve. She verbalized understanding and is comfortable with this plan.     I personally performed the services described in this  documentation, which was scribed in my presence. The recorded information has been reviewed and is accurate.    Layla Maw Lesle Faron, DO 01/20/16 0045

## 2016-01-19 NOTE — ED Notes (Signed)
Pt c/o left elbow pain x 3 days and a headache since today. Pt has a history of fracture to this arm and was unable to follow up. Pt lifted her child 3 days ago and heard pop in left elbow. Reports headache just prior to arrival.

## 2016-01-20 MED ORDER — NAPROXEN 500 MG PO TABS: 500.0000 mg | ORAL_TABLET | Freq: Two times a day (BID) | ORAL | Status: DC

## 2016-01-20 NOTE — Discharge Instructions (Signed)
You were seen in the emergency department for left elbow pain. You have a old radial head fracture that is healing. No new fractures seen. I recommend you wear a sling for comfort but take your arm out of the sling regularly to move your arm. You may apply ice and elevate your arm to help with pain and swelling. I recommend use anti-inflammatories for pain control. If your symptoms are not improving in 1 week, you should follow-up with an orthopedic physician.   Migraine Headache A migraine headache is an intense, throbbing pain on one or both sides of your head. A migraine can last for 30 minutes to several hours. CAUSES  The exact cause of a migraine headache is not always known. However, a migraine may be caused when nerves in the brain become irritated and release chemicals that cause inflammation. This causes pain. Certain things may also trigger migraines, such as:  Alcohol.  Smoking.  Stress.  Menstruation.  Aged cheeses.  Foods or drinks that contain nitrates, glutamate, aspartame, or tyramine.  Lack of sleep.  Chocolate.  Caffeine.  Hunger.  Physical exertion.  Fatigue.  Medicines used to treat chest pain (nitroglycerine), birth control pills, estrogen, and some blood pressure medicines. SIGNS AND SYMPTOMS  Pain on one or both sides of your head.  Pulsating or throbbing pain.  Severe pain that prevents daily activities.  Pain that is aggravated by any physical activity.  Nausea, vomiting, or both.  Dizziness.  Pain with exposure to bright lights, loud noises, or activity.  General sensitivity to bright lights, loud noises, or smells. Before you get a migraine, you may get warning signs that a migraine is coming (aura). An aura may include:  Seeing flashing lights.  Seeing bright spots, halos, or zigzag lines.  Having tunnel vision or blurred vision.  Having feelings of numbness or tingling.  Having trouble talking.  Having muscle  weakness. DIAGNOSIS  A migraine headache is often diagnosed based on:  Symptoms.  Physical exam.  A CT scan or MRI of your head. These imaging tests cannot diagnose migraines, but they can help rule out other causes of headaches. TREATMENT Medicines may be given for pain and nausea. Medicines can also be given to help prevent recurrent migraines.  HOME CARE INSTRUCTIONS  Only take over-the-counter or prescription medicines for pain or discomfort as directed by your health care provider. The use of long-term narcotics is not recommended.  Lie down in a dark, quiet room when you have a migraine.  Keep a journal to find out what may trigger your migraine headaches. For example, write down:  What you eat and drink.  How much sleep you get.  Any change to your diet or medicines.  Limit alcohol consumption.  Quit smoking if you smoke.  Get 7-9 hours of sleep, or as recommended by your health care provider.  Limit stress.  Keep lights dim if bright lights bother you and make your migraines worse. SEEK IMMEDIATE MEDICAL CARE IF:   Your migraine becomes severe.  You have a fever.  You have a stiff neck.  You have vision loss.  You have muscular weakness or loss of muscle control.  You start losing your balance or have trouble walking.  You feel faint or pass out.  You have severe symptoms that are different from your first symptoms. MAKE SURE YOU:   Understand these instructions.  Will watch your condition.  Will get help right away if you are not doing well or get  worse.   This information is not intended to replace advice given to you by your health care provider. Make sure you discuss any questions you have with your health care provider.   Document Released: 11/16/2005 Document Revised: 12/07/2014 Document Reviewed: 07/24/2013 Elsevier Interactive Patient Education 2016 Elsevier Inc.   RICE for Routine Care of Injuries Theroutine  careofmanyinjuriesincludes rest, ice, compression, and elevation (RICE therapy). RICE therapy is often recommended for injuries to soft tissues, such as a muscle strain, ligament injuries, bruises, and overuse injuries. It can also be used for some bony injuries. Using RICE therapy can help to relieve pain, lessen swelling, and enable your body to heal. Rest Rest is required to allow your body to heal. This usually involves reducing your normal activities and avoiding use of the injured part of your body. Generally, you can return to your normal activities when you are comfortable and have been given permission by your health care provider. Ice Icing your injury helps to keep the swelling down, and it lessens pain. Do not apply ice directly to your skin.  Put ice in a plastic bag.  Place a towel between your skin and the bag.  Leave the ice on for 20 minutes, 2-3 times a day. Do this for as long as you are directed by your health care provider. Compression Compression means putting pressure on the injured area. Compression helps to keep swelling down, gives support, and helps with discomfort. Compression may be done with an elastic bandage. If an elastic bandage has been applied, follow these general tips:  Remove and reapply the bandage every 3-4 hours or as directed by your health care provider.  Make sure the bandage is not wrapped too tightly, because this can cut off circulation. If part of your body beyond the bandage becomes blue, numb, cold, swollen, or more painful, your bandage is most likely too tight. If this occurs, remove your bandage and reapply it more loosely.  See your health care provider if the bandage seems to be making your problems worse rather than better. Elevation Elevation means keeping the injured area raised. This helps to lessen swelling and decrease pain. If possible, your injured area should be elevated at or above the level of your heart or the center of your  chest. WHEN SHOULD I SEEK MEDICAL CARE? You should seek medical care if:  Your pain and swelling continue.  Your symptoms are getting worse rather than improving. These symptoms may indicate that further evaluation or further X-rays are needed. Sometimes, X-rays may not show a small broken bone (fracture) until a number of days later. Make a follow-up appointment with your health care provider. WHEN SHOULD I SEEK IMMEDIATE MEDICAL CARE? You should seek immediate medical care if:  You have sudden severe pain at or below the area of your injury.  You have redness or increased swelling around your injury.  You have tingling or numbness at or below the area of your injury that does not improve after you remove the elastic bandage.   This information is not intended to replace advice given to you by your health care provider. Make sure you discuss any questions you have with your health care provider.   Document Released: 02/28/2001 Document Revised: 08/07/2015 Document Reviewed: 10/24/2014 Elsevier Interactive Patient Education [employer]

## 2016-11-13 IMAGING — DX DG ELBOW COMPLETE 3+V*L*
4 series · 4 of 4 positions shown · non-contrast
Comparison: Left elbow radiographs 01/14/2015

CLINICAL DATA: Constant, aching, worsening left elbow pain onset 2
days ago. Patient broke left elbow 1 year ago. She states that since
then she is unable to fully extend left arm. Patient reports
re-fracture a few months after that. Two days ago felt a pop,
worsening pain since that time.

EXAM:
LEFT ELBOW - COMPLETE 3+ VIEW

[elbow ap]
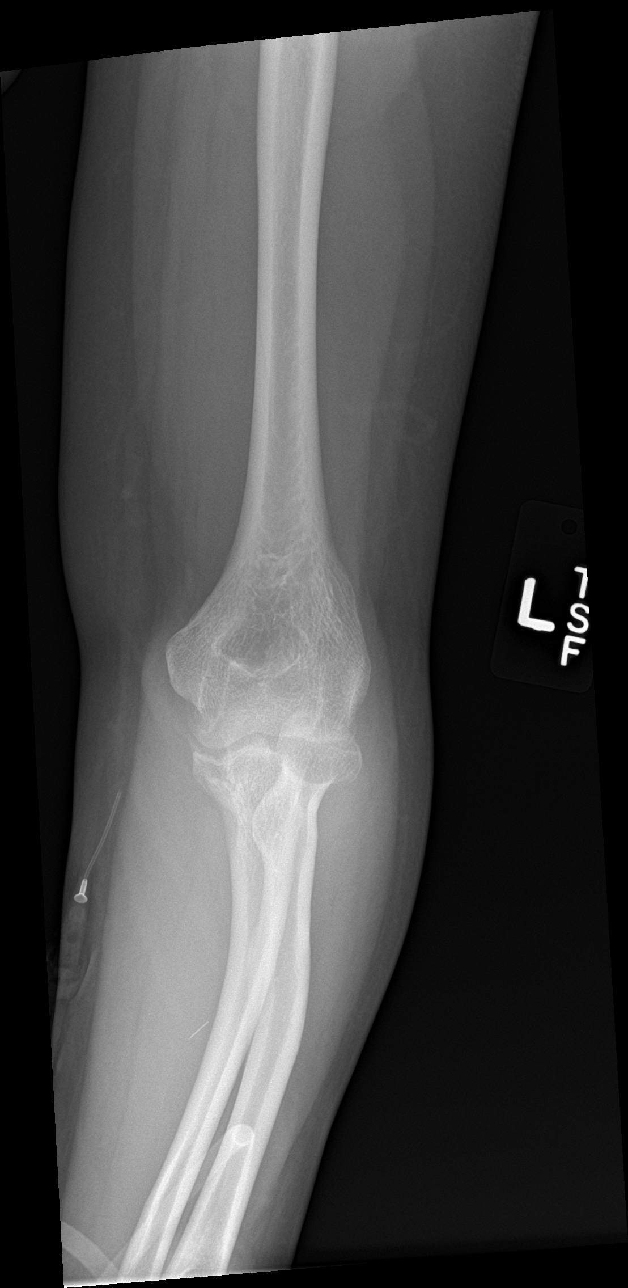

[elbow obl (1 of 2)]
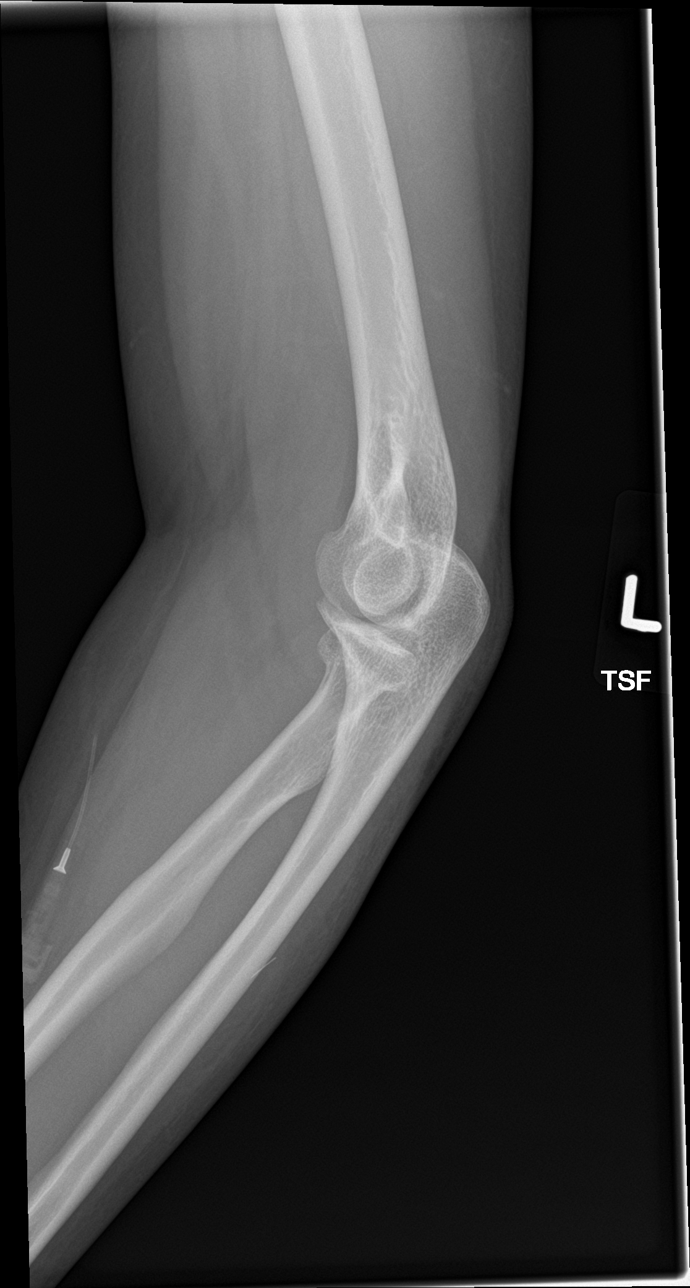

[elbow obl (2 of 2)]
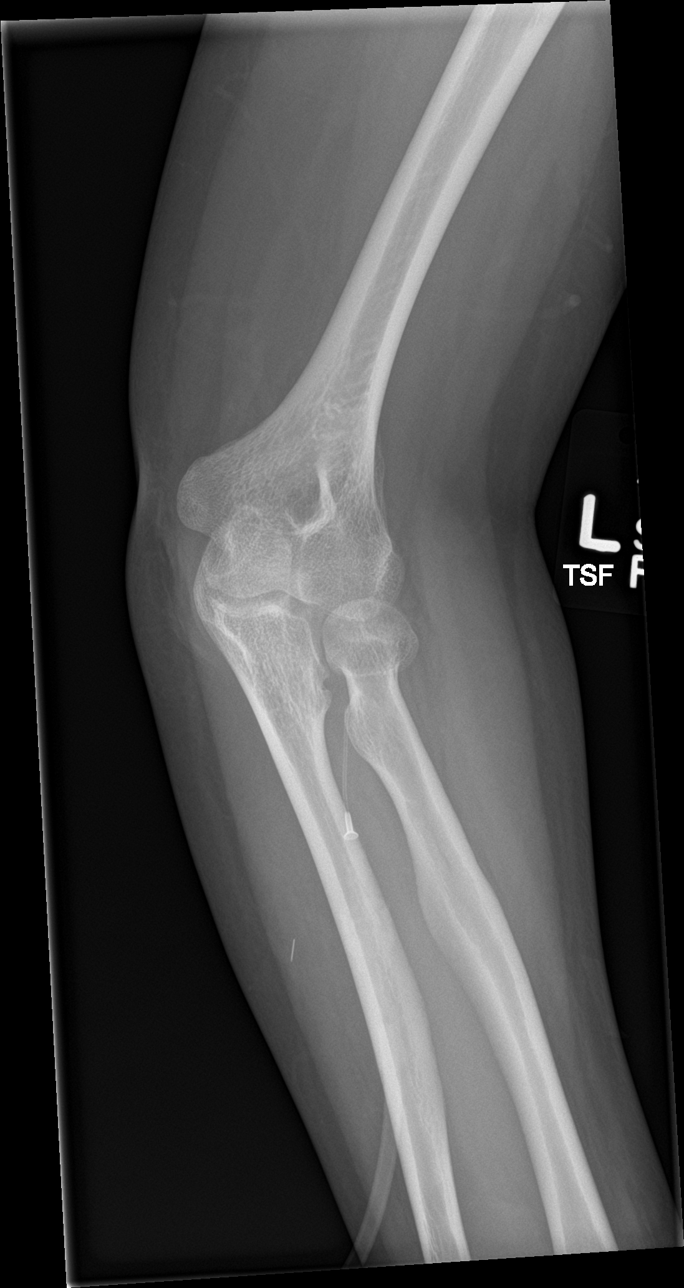

[elbow lat]
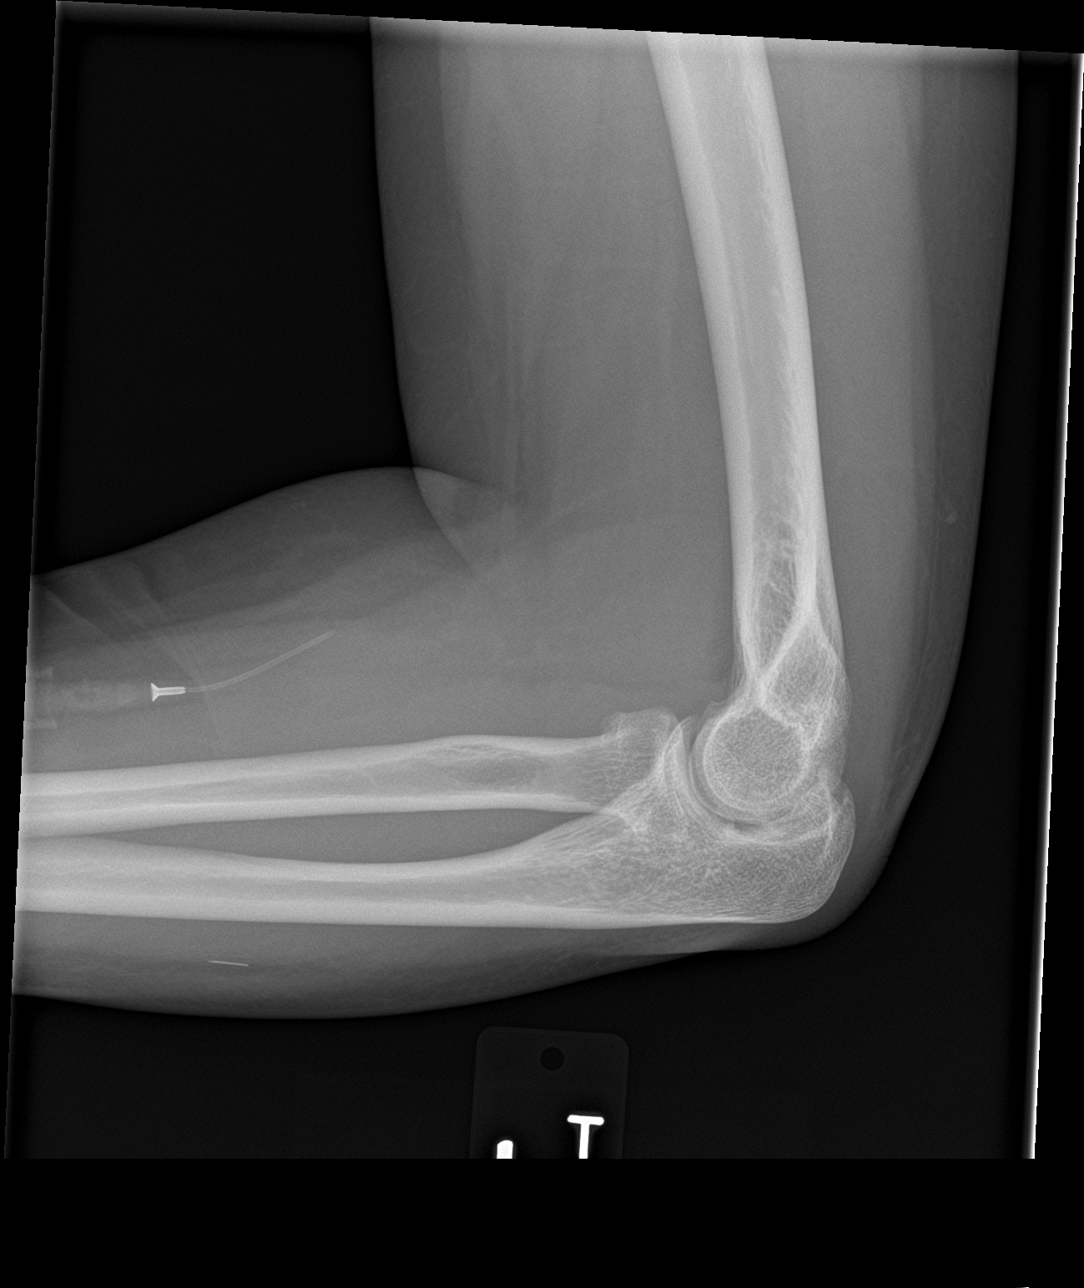

[4 of 4 positions shown; findings below may reference images not displayed]

FINDINGS: Previous radial head fracture is no longer seen, with interval
healing. No acute fracture. Small osteophytes at the radial head
neck junction. The alignment is maintained. No elbow joint effusion.
No bony destructive change. There is a 6 mm linear foreign body in
the dorsal aspect of the forearm superficial to the ulnar shaft
proximally and 10 cm from the olecranon, consistent with foreign
body, possible needle tip.
IMPRESSION: 1. Healing of previous radial head fracture, no acute bony
abnormality.
2. Linear 6 mm foreign body in the dorsal aspect of the proximal
forearm, consistent foreign body, possible retained needle tip.

## 2022-08-04 DIAGNOSIS — M2630 Unspecified anomaly of tooth position of fully erupted tooth or teeth: Secondary | ICD-10-CM | POA: Diagnosis not present

## 2022-09-04 DIAGNOSIS — L0390 Cellulitis, unspecified: Secondary | ICD-10-CM | POA: Diagnosis not present

## 2022-09-04 DIAGNOSIS — N390 Urinary tract infection, site not specified: Secondary | ICD-10-CM | POA: Diagnosis not present

## 2022-09-06 DIAGNOSIS — F1120 Opioid dependence, uncomplicated: Secondary | ICD-10-CM | POA: Diagnosis not present

## 2022-09-28 DIAGNOSIS — F1120 Opioid dependence, uncomplicated: Secondary | ICD-10-CM | POA: Diagnosis not present

## 2022-09-28 DIAGNOSIS — F411 Generalized anxiety disorder: Secondary | ICD-10-CM | POA: Diagnosis not present

## 2022-10-26 DIAGNOSIS — F1120 Opioid dependence, uncomplicated: Secondary | ICD-10-CM | POA: Diagnosis not present

## 2022-10-26 DIAGNOSIS — F5104 Psychophysiologic insomnia: Secondary | ICD-10-CM | POA: Diagnosis not present

## 2022-11-02 ENCOUNTER — Emergency Department (HOSPITAL_COMMUNITY)
Admission: EM | Admit: 2022-11-02 | Discharge: 2022-11-02 | Discharge status: Against Medical Advice | Payer: BLUE CROSS/BLUE SHIELD | Attending: Student | Admitting: Student

## 2022-11-02 ENCOUNTER — Emergency Department (HOSPITAL_COMMUNITY): Payer: BLUE CROSS/BLUE SHIELD

## 2022-11-02 ENCOUNTER — Encounter (HOSPITAL_COMMUNITY): Payer: Self-pay | Admitting: Emergency Medicine

## 2022-11-02 DIAGNOSIS — R519 Headache, unspecified: Secondary | ICD-10-CM | POA: Diagnosis not present

## 2022-11-02 DIAGNOSIS — S8992XA Unspecified injury of left lower leg, initial encounter: Secondary | ICD-10-CM | POA: Diagnosis not present

## 2022-11-02 DIAGNOSIS — Y92410 Unspecified street and highway as the place of occurrence of the external cause: Secondary | ICD-10-CM | POA: Diagnosis not present

## 2022-11-02 DIAGNOSIS — R070 Pain in throat: Secondary | ICD-10-CM | POA: Diagnosis not present

## 2022-11-02 DIAGNOSIS — Z5321 Procedure and treatment not carried out due to patient leaving prior to being seen by health care provider: Secondary | ICD-10-CM | POA: Diagnosis not present

## 2022-11-02 DIAGNOSIS — M79632 Pain in left forearm: Secondary | ICD-10-CM | POA: Diagnosis not present

## 2022-11-02 DIAGNOSIS — G4489 Other headache syndrome: Secondary | ICD-10-CM | POA: Diagnosis not present

## 2022-11-02 DIAGNOSIS — M79605 Pain in left leg: Secondary | ICD-10-CM | POA: Diagnosis not present

## 2022-11-02 LAB — I-STAT BETA HCG BLOOD, ED (MC, WL, AP ONLY): I-stat hCG, quantitative: 36.1 m[IU]/mL — ABNORMAL HIGH (ref <5)

## 2022-11-02 NOTE — ED Notes (Signed)
Pt called 3x for updated VS with no response. Taking pt OTF.
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
## 2022-11-11 DIAGNOSIS — F1120 Opioid dependence, uncomplicated: Secondary | ICD-10-CM | POA: Diagnosis not present

## 2022-12-16 DIAGNOSIS — R112 Nausea with vomiting, unspecified: Secondary | ICD-10-CM | POA: Diagnosis not present

## 2023-01-05 DIAGNOSIS — R112 Nausea with vomiting, unspecified: Secondary | ICD-10-CM | POA: Diagnosis not present

## 2023-01-26 DIAGNOSIS — L0390 Cellulitis, unspecified: Secondary | ICD-10-CM | POA: Diagnosis not present

## 2023-04-08 ENCOUNTER — Telehealth: Payer: Self-pay | Admitting: Pediatrics

## 2023-04-08 NOTE — Telephone Encounter (Signed)
Patient referral information received from [facility].  Initial NAS telephone consult complete.  Patient is G4 by report and currently using heroin daily until she can be seen for care.  She desires MOUD as well as OB care.  Per her LMP report of 09/16/22 she is ~ [redacted] weeks gestation.  She reports that baby is active and she, herself "feels huge" as compared to her prior pregnancies.  Mom is aware that Dr. Audie Clear office will contact her for new OB appointment where she will discuss MOUD at that time.  We reviewed preliminary newborn management in hospital and community resources.  Mom's primary concern is CPS involvement so we talked about hospital screening guidelines and what potential CPS involvement could entail.  She is receptive to continued NAS consults and eager to begin treatment.  She has NAS consult line [phone] and will reach out with concerns prior to our next call on 5/17.

## 2023-04-09 ENCOUNTER — Encounter: Payer: BLUE CROSS/BLUE SHIELD | Admitting: Family Medicine

## 2023-04-09 ENCOUNTER — Inpatient Hospital Stay (HOSPITAL_COMMUNITY)
Admission: AD | Admit: 2023-04-09 | Discharge: 2023-04-13 | DRG: 832 | Disposition: A | Discharge status: Home / Self Care | Payer: BLUE CROSS/BLUE SHIELD | Attending: Obstetrics & Gynecology | Admitting: Obstetrics & Gynecology

## 2023-04-09 DIAGNOSIS — O99323 Drug use complicating pregnancy, third trimester: Principal | ICD-10-CM | POA: Diagnosis present

## 2023-04-09 DIAGNOSIS — F17210 Nicotine dependence, cigarettes, uncomplicated: Secondary | ICD-10-CM | POA: Diagnosis present

## 2023-04-09 DIAGNOSIS — O99333 Smoking (tobacco) complicating pregnancy, third trimester: Secondary | ICD-10-CM | POA: Diagnosis present

## 2023-04-09 DIAGNOSIS — Z8742 Personal history of other diseases of the female genital tract: Secondary | ICD-10-CM

## 2023-04-09 DIAGNOSIS — B1920 Unspecified viral hepatitis C without hepatic coma: Secondary | ICD-10-CM | POA: Diagnosis present

## 2023-04-09 DIAGNOSIS — O0933 Supervision of pregnancy with insufficient antenatal care, third trimester: Secondary | ICD-10-CM

## 2023-04-09 DIAGNOSIS — R7689 Other specified abnormal immunological findings in serum: Secondary | ICD-10-CM

## 2023-04-09 DIAGNOSIS — O98413 Viral hepatitis complicating pregnancy, third trimester: Secondary | ICD-10-CM | POA: Diagnosis present

## 2023-04-09 DIAGNOSIS — Z3A29 29 weeks gestation of pregnancy: Secondary | ICD-10-CM

## 2023-04-09 DIAGNOSIS — F1110 Opioid abuse, uncomplicated: Secondary | ICD-10-CM

## 2023-04-09 DIAGNOSIS — F1190 Opioid use, unspecified, uncomplicated: Secondary | ICD-10-CM | POA: Diagnosis present

## 2023-04-09 DIAGNOSIS — O321XX0 Maternal care for breech presentation, not applicable or unspecified: Secondary | ICD-10-CM | POA: Diagnosis present

## 2023-04-09 DIAGNOSIS — R768 Other specified abnormal immunological findings in serum: Secondary | ICD-10-CM

## 2023-04-10 ENCOUNTER — Inpatient Hospital Stay (HOSPITAL_BASED_OUTPATIENT_CLINIC_OR_DEPARTMENT_OTHER): Payer: BLUE CROSS/BLUE SHIELD

## 2023-04-10 ENCOUNTER — Encounter (HOSPITAL_COMMUNITY): Payer: Self-pay

## 2023-04-10 DIAGNOSIS — Z3A29 29 weeks gestation of pregnancy: Secondary | ICD-10-CM | POA: Diagnosis not present

## 2023-04-10 DIAGNOSIS — O0932 Supervision of pregnancy with insufficient antenatal care, second trimester: Secondary | ICD-10-CM | POA: Diagnosis not present

## 2023-04-10 DIAGNOSIS — O321XX0 Maternal care for breech presentation, not applicable or unspecified: Secondary | ICD-10-CM | POA: Diagnosis not present

## 2023-04-10 DIAGNOSIS — F1110 Opioid abuse, uncomplicated: Secondary | ICD-10-CM | POA: Diagnosis not present

## 2023-04-10 DIAGNOSIS — O99332 Smoking (tobacco) complicating pregnancy, second trimester: Secondary | ICD-10-CM

## 2023-04-10 DIAGNOSIS — O99322 Drug use complicating pregnancy, second trimester: Secondary | ICD-10-CM

## 2023-04-10 DIAGNOSIS — F1510 Other stimulant abuse, uncomplicated: Secondary | ICD-10-CM | POA: Diagnosis not present

## 2023-04-10 DIAGNOSIS — O99323 Drug use complicating pregnancy, third trimester: Secondary | ICD-10-CM | POA: Diagnosis not present

## 2023-04-10 DIAGNOSIS — O99333 Smoking (tobacco) complicating pregnancy, third trimester: Secondary | ICD-10-CM | POA: Diagnosis not present

## 2023-04-10 DIAGNOSIS — O09212 Supervision of pregnancy with history of pre-term labor, second trimester: Secondary | ICD-10-CM

## 2023-04-10 DIAGNOSIS — F1190 Opioid use, unspecified, uncomplicated: Secondary | ICD-10-CM | POA: Diagnosis not present

## 2023-04-10 DIAGNOSIS — O0933 Supervision of pregnancy with insufficient antenatal care, third trimester: Secondary | ICD-10-CM | POA: Diagnosis not present

## 2023-04-10 DIAGNOSIS — Z3A26 26 weeks gestation of pregnancy: Secondary | ICD-10-CM

## 2023-04-10 DIAGNOSIS — B1920 Unspecified viral hepatitis C without hepatic coma: Secondary | ICD-10-CM | POA: Diagnosis not present

## 2023-04-10 DIAGNOSIS — O98413 Viral hepatitis complicating pregnancy, third trimester: Secondary | ICD-10-CM | POA: Diagnosis not present

## 2023-04-10 DIAGNOSIS — F17210 Nicotine dependence, cigarettes, uncomplicated: Secondary | ICD-10-CM | POA: Diagnosis not present

## 2023-04-10 LAB — COMPREHENSIVE METABOLIC PANEL
ALT: 15 U/L (ref 0–44)
AST: 20 U/L (ref 15–41)
Albumin: 2.7 g/dL — ABNORMAL LOW (ref 3.5–5.0)
Alkaline Phosphatase: 80 U/L (ref 38–126)
Anion gap: 6 (ref 5–15)
BUN: 6 mg/dL (ref 6–20)
CO2: 27 mmol/L (ref 22–32)
Calcium: 8.8 mg/dL — ABNORMAL LOW (ref 8.9–10.3)
Chloride: 101 mmol/L (ref 98–111)
Creatinine, Ser: 0.47 mg/dL (ref 0.44–1.00)
GFR, Estimated: >60 mL/min (ref >60)
Glucose, Bld: 87 mg/dL (ref 70–99)
Potassium: 4 mmol/L (ref 3.5–5.1)
Sodium: 134 mmol/L — ABNORMAL LOW (ref 135–145)
Total Bilirubin: 0.5 mg/dL (ref 0.3–1.2)
Total Protein: 6.2 g/dL — ABNORMAL LOW (ref 6.5–8.1)

## 2023-04-10 LAB — DIFFERENTIAL
Abs Immature Granulocytes: 0.04 10*3/uL (ref 0.00–0.07)
Basophils Absolute: 0 10*3/uL (ref 0.0–0.1)
Basophils Relative: 0 %
Eosinophils Absolute: 0.2 10*3/uL (ref 0.0–0.5)
Eosinophils Relative: 2 %
Immature Granulocytes: 1 %
Lymphocytes Relative: 35 %
Lymphs Abs: 2.8 10*3/uL (ref 0.7–4.0)
Monocytes Absolute: 0.9 10*3/uL (ref 0.1–1.0)
Monocytes Relative: 11 %
Neutro Abs: 4.2 10*3/uL (ref 1.7–7.7)
Neutrophils Relative %: 51 %

## 2023-04-10 LAB — CBC
HCT: 30.1 % — ABNORMAL LOW (ref 36.0–46.0)
Hemoglobin: 10.4 g/dL — ABNORMAL LOW (ref 12.0–15.0)
MCH: 29.7 pg (ref 26.0–34.0)
MCHC: 34.6 g/dL (ref 30.0–36.0)
MCV: 86 fL (ref 80.0–100.0)
Platelets: 234 10*3/uL (ref 150–400)
RBC: 3.5 MIL/uL — ABNORMAL LOW (ref 3.87–5.11)
RDW: 13 % (ref 11.5–15.5)
WBC: 8.1 10*3/uL (ref 4.0–10.5)
nRBC: 0 % (ref 0.0–0.2)

## 2023-04-10 LAB — WET PREP, GENITAL
Sperm: NONE SEEN
Trich, Wet Prep: NONE SEEN
WBC, Wet Prep HPF POC: >=10 — AB (ref <10)
Yeast Wet Prep HPF POC: NONE SEEN

## 2023-04-10 LAB — RPR: RPR Ser Ql: NONREACTIVE

## 2023-04-10 LAB — HEMOGLOBIN A1C
Hgb A1c MFr Bld: 4.5 % — ABNORMAL LOW (ref 4.8–5.6)
Mean Plasma Glucose: 82.45 mg/dL

## 2023-04-10 LAB — TYPE AND SCREEN
ABO/RH(D): O POS
Antibody Screen: NEGATIVE

## 2023-04-10 LAB — HEPATITIS C ANTIBODY: HCV Ab: REACTIVE — AB

## 2023-04-10 LAB — HIV ANTIBODY (ROUTINE TESTING W REFLEX): HIV Screen 4th Generation wRfx: NONREACTIVE

## 2023-04-10 LAB — HEPATITIS B SURFACE ANTIGEN: Hepatitis B Surface Ag: NONREACTIVE

## 2023-04-10 MED ORDER — CLONAZEPAM 0.5 MG PO TABS
0.5000 mg | ORAL_TABLET | Freq: Three times a day (TID) | ORAL | Status: DC | PRN
  Administered 2023-04-10 – 2023-04-12 (×4): 0.5 mg via ORAL
  Filled 2023-04-10 (×4): qty 1

## 2023-04-10 MED ORDER — PRENATAL MULTIVITAMIN CH
1.0000 | ORAL_TABLET | Freq: Every day | ORAL | Status: DC
  Administered 2023-04-10: 1 via ORAL
  Filled 2023-04-10 (×4): qty 1

## 2023-04-10 MED ORDER — CYCLOBENZAPRINE HCL 10 MG PO TABS
10.0000 mg | ORAL_TABLET | Freq: Three times a day (TID) | ORAL | Status: DC | PRN
  Administered 2023-04-10 – 2023-04-12 (×5): 10 mg via ORAL
  Filled 2023-04-10 (×5): qty 1

## 2023-04-10 MED ORDER — FERROUS SULFATE 325 (65 FE) MG PO TABS
325.0000 mg | ORAL_TABLET | ORAL | Status: DC
  Administered 2023-04-10 – 2023-04-12 (×2): 325 mg via ORAL
  Filled 2023-04-10 (×3): qty 1

## 2023-04-10 MED ORDER — ZOLPIDEM TARTRATE 5 MG PO TABS
5.0000 mg | ORAL_TABLET | Freq: Every evening | ORAL | Status: DC | PRN
  Administered 2023-04-12: 5 mg via ORAL
  Filled 2023-04-10: qty 1

## 2023-04-10 MED ORDER — DICYCLOMINE HCL 10 MG PO CAPS
10.0000 mg | ORAL_CAPSULE | Freq: Three times a day (TID) | ORAL | Status: DC
  Administered 2023-04-10 – 2023-04-13 (×9): 10 mg via ORAL
  Filled 2023-04-10 (×13): qty 1

## 2023-04-10 MED ORDER — CALCIUM CARBONATE ANTACID 500 MG PO CHEW
2.0000 | CHEWABLE_TABLET | ORAL | Status: DC | PRN
  Administered 2023-04-10: 400 mg via ORAL
  Filled 2023-04-10: qty 2

## 2023-04-10 MED ORDER — ACETAMINOPHEN 325 MG PO TABS: 650.0000 mg | ORAL_TABLET | ORAL | Status: DC | PRN

## 2023-04-10 MED ORDER — METHADONE HCL 10 MG PO TABS
10.0000 mg | ORAL_TABLET | ORAL | Status: DC | PRN
  Administered 2023-04-10 – 2023-04-11 (×3): 10 mg via ORAL
  Filled 2023-04-10 (×5): qty 1

## 2023-04-10 MED ORDER — LOPERAMIDE HCL 2 MG PO CAPS: 2.0000 mg | ORAL_CAPSULE | ORAL | Status: DC | PRN

## 2023-04-10 MED ORDER — ONDANSETRON 4 MG PO TBDP: 4.0000 mg | ORAL_TABLET | Freq: Four times a day (QID) | ORAL | Status: DC | PRN

## 2023-04-10 MED ORDER — DOCUSATE SODIUM 100 MG PO CAPS
100.0000 mg | ORAL_CAPSULE | Freq: Every day | ORAL | Status: DC
  Administered 2023-04-10: 100 mg via ORAL
  Filled 2023-04-10 (×3): qty 1

## 2023-04-10 MED ORDER — LACTATED RINGERS IV SOLN: 125.0000 mL/h | INTRAVENOUS | Status: AC

## 2023-04-10 MED ORDER — METHADONE HCL 10 MG PO TABS
40.0000 mg | ORAL_TABLET | Freq: Every day | ORAL | Status: DC
  Administered 2023-04-11: 40 mg via ORAL
  Filled 2023-04-10: qty 4

## 2023-04-10 NOTE — MAU Note (Addendum)
.  Kathy Pearson is a 32 y.o. at Unknown here in MAU reporting:  Detoxification, reports Dr. Crissie Reese sent her in for detoxification. Reports heroin injection use today about one-two hours ago.  Patient reports being sober for 6 years but had a death in family and began using again, reports one month later found out she was pregnant; reports heroin use throughout pregnancy.  Reports got into contact with Dr. Crissie Reese through [facility].   Denies LOF, VB and reports + FM.  Patient reports no PNC during pregnancy.   LMP: 09/16/2022 patient not 100% sure  Onset of complaint: 04/09/2022 Pain score: no/denies pain  Vitals:   04/10/23 0004 04/10/23 0005  BP: 106/65   Pulse: 92   Resp: 19   Temp: 97.7 F (36.5 C)   SpO2: 99% 99%     FHT:133  Lab orders placed from triage:  n/a

## 2023-04-10 NOTE — H&P (Signed)
History     CSN: [id number]  Arrival date and time: 04/09/23 2338   None     Chief Complaint  Patient presents with   detox   HPI  Kathy Pearson is a 32 y.o. [phone] at approximately [redacted] weeks gestation who presents for detox from heroin. Patient reports she is using heroin injection daily and meth occasionally. She reports she last used a "20 bag" about 1 hour before arrival to MAU. She reports she used heroin in the past and was able to get clean using suboxone but it caused terrible withdrawals and she does not want to use that again. She is asking about methadone treatment.   She has not had any care in the pregnancy. She denies any abdominal pain.  She denies any vaginal bleeding, discharge, and leaking of fluid. Denies any constipation, diarrhea or any urinary complaints. Reports normal fetal movement.   OB History     Gravida  4   Para  3   Term  0   Preterm  3   AB      Living  3      SAB      IAB      Ectopic      Multiple      Live Births  3           Past Medical History:  Diagnosis Date   Anxiety    Depression    Heroin abuse (HCC)     Past Surgical History:  Procedure Laterality Date   NO PAST SURGERIES      History reviewed. No pertinent family history.  Social History   Tobacco Use   Smoking status: Every Day    Types: Cigarettes  Substance Use Topics   Alcohol use: No   Drug use: Yes    Types: Heroin, Methamphetamines    Comment: meth use 2 days ago per patient; heroin use 04/10/23    Allergies:  Allergies  Allergen Reactions   Clindamycin/Lincomycin Anaphylaxis   Ultram [Tramadol] Anaphylaxis    Medications Prior to Admission  Medication Sig Dispense Refill Last Dose   Prenatal Vit-Fe Fumarate-FA (PRENATAL MULTIVITAMIN) TABS tablet Take 1 tablet by mouth daily at 12 noon.   Past Week   naproxen (NAPROSYN) 500 MG tablet Take 1 tablet (500 mg total) by mouth 2 (two) times daily with a meal. 30 tablet 0     Review  of Systems  Constitutional: Negative.  Negative for fatigue and fever.  HENT: Negative.    Respiratory: Negative.  Negative for shortness of breath.   Cardiovascular: Negative.  Negative for chest pain.  Gastrointestinal: Negative.  Negative for abdominal pain, constipation, diarrhea, nausea and vomiting.  Genitourinary: Negative.  Negative for dysuria, vaginal bleeding and vaginal discharge.  Neurological: Negative.  Negative for dizziness and headaches.   Physical Exam   Blood pressure 106/65, pulse 92, temperature 97.7 F (36.5 C), temperature source Oral, resp. rate 19, last menstrual period 10/27/2022, SpO2 99 %.  Patient Vitals for the past 24 hrs:  BP Temp Temp src Pulse Resp SpO2  04/10/23 0005 -- -- -- -- -- 99 %  04/10/23 0004 106/65 97.7 F (36.5 C) Oral 92 19 99 %    Physical Exam Vitals and nursing note reviewed.  Constitutional:      General: She is not in acute distress.    Appearance: She is well-developed.  HENT:     Head: Normocephalic.  Eyes:     Pupils: Pupils  are equal, round, and reactive to light.  Cardiovascular:     Rate and Rhythm: Normal rate and regular rhythm.     Heart sounds: Normal heart sounds.  Pulmonary:     Effort: Pulmonary effort is normal. No respiratory distress.     Breath sounds: Normal breath sounds.  Abdominal:     General: Bowel sounds are normal. There is no distension.     Palpations: Abdomen is soft.     Tenderness: There is no abdominal tenderness.  Skin:    General: Skin is warm and dry.  Neurological:     Mental Status: She is alert and oriented to person, place, and time.  Psychiatric:        Mood and Affect: Mood normal.        Behavior: Behavior normal.        Thought Content: Thought content normal.        Judgment: Judgment normal.     Fetal Tracing:  Baseline: 125 Variability: minimal Accels: none Decels: none  Toco: none      MAU Course  Procedures  MDM Labs ordered and reviewed.   CNM  consulted with Dr. Crissie Reese for plan of care- MD states ok to start methadone but needs admission to [facility] for management through weekend until patient can be connected to clinic on Monday.   MD recommends 10mg  methadone q1 PRN up to 40mg  total. If no relief, contact Dr. Crissie Reese.  Dr. Berton Lan notified of plan of care. Will place admission orders including OB care and ultrasound.   Assessment and Plan   1. Heroin abuse affecting pregnancy in third trimester (HCC)   2. [redacted] weeks gestation of pregnancy     -Admit to [facility] -Care turned over to MD  Rolm Bookbinder, CNM 04/10/2023, 12:17 AM

## 2023-04-10 NOTE — Progress Notes (Signed)
Patient ID: Kathy Pearson, female   DOB: [birth year], 32 y.o.   MRN: [id number] ACULTY PRACTICE ANTEPARTUM COMPREHENSIVE PROGRESS NOTE  Kathy Pearson is a 32 y.o. [phone] at [redacted]w[redacted]d  who is admitted for initiation of Methadone.  Fetal presentation is breech. Length of Stay:  0  Days  Subjective: Pt reports starting to have some withdrawal Sx now. Has not had any PRN Methadone yet Patient reports good fetal movement.  She reports no uterine contractions, no bleeding and no loss of fluid per vagina.  Vitals:  Blood pressure (!) 99/58, pulse 75, temperature 97.7 F (36.5 C), temperature source Oral, resp. rate 18, height 5\' 4"  (1.626 m), weight 60.9 kg, last menstrual period 10/27/2022, SpO2 98 %.  Physical Examination: Lungs clear Heart RRR Abd soft + BS gravid Ext non tender  Fetal Monitoring:  Baseline: 130's bpm, Variability: Fair (1-6 bpm), Accelerations: Non-reactive but appropriate for gestational age, and Decelerations: Absent  Labs:  Results for orders placed or performed during the hospital encounter of 04/09/23 (from the past 24 hour(s))  Hemoglobin A1c   Collection Time: 04/10/23  1:32 AM  Result Value Ref Range   Hgb A1c MFr Bld 4.5 (L) 4.8 - 5.6 %   Mean Plasma Glucose 82.45 mg/dL  Type and screen [facility]   Collection Time: 04/10/23  1:32 AM  Result Value Ref Range   ABO/RH(D) O POS    Antibody Screen NEG    Sample Expiration      04/13/2023,2359 Performed at [facility], [address], [place], [place] [address]   Hepatitis B surface antigen   Collection Time: 04/10/23  1:34 AM  Result Value Ref Range   Hepatitis B Surface Ag NON REACTIVE NON REACTIVE  CBC   Collection Time: 04/10/23  1:34 AM  Result Value Ref Range   WBC 8.1 4.0 - 10.5 K/uL   RBC 3.50 (L) 3.87 - 5.11 MIL/uL   Hemoglobin 10.4 (L) 12.0 - 15.0 g/dL   HCT 95.6 (L) 21.3 - 08.6 %   MCV 86.0 80.0 - 100.0 fL   MCH 29.7 26.0 - 34.0 pg   MCHC 34.6 30.0 - 36.0 g/dL   RDW 57.8  46.9 - 62.9 %   Platelets 234 150 - 400 K/uL   nRBC 0.0 0.0 - 0.2 %  Differential   Collection Time: 04/10/23  1:34 AM  Result Value Ref Range   Neutrophils Relative % 51 %   Neutro Abs 4.2 1.7 - 7.7 K/uL   Lymphocytes Relative 35 %   Lymphs Abs 2.8 0.7 - 4.0 K/uL   Monocytes Relative 11 %   Monocytes Absolute 0.9 0.1 - 1.0 K/uL   Eosinophils Relative 2 %   Eosinophils Absolute 0.2 0.0 - 0.5 K/uL   Basophils Relative 0 %   Basophils Absolute 0.0 0.0 - 0.1 K/uL   Immature Granulocytes 1 %   Abs Immature Granulocytes 0.04 0.00 - 0.07 K/uL  HIV Antibody (routine testing w rflx)   Collection Time: 04/10/23  1:34 AM  Result Value Ref Range   HIV Screen 4th Generation wRfx Non Reactive Non Reactive  Comprehensive metabolic panel   Collection Time: 04/10/23  1:34 AM  Result Value Ref Range   Sodium 134 (L) 135 - 145 mmol/L   Potassium 4.0 3.5 - 5.1 mmol/L   Chloride 101 98 - 111 mmol/L   CO2 27 22 - 32 mmol/L   Glucose, Bld 87 70 - 99 mg/dL   BUN 6  6 - 20 mg/dL   Creatinine, Ser 9.60 0.44 - 1.00 mg/dL   Calcium 8.8 (L) 8.9 - 10.3 mg/dL   Total Protein 6.2 (L) 6.5 - 8.1 g/dL   Albumin 2.7 (L) 3.5 - 5.0 g/dL   AST 20 15 - 41 U/L   ALT 15 0 - 44 U/L   Alkaline Phosphatase 80 38 - 126 U/L   Total Bilirubin 0.5 0.3 - 1.2 mg/dL   GFR, Estimated >45 >40 mL/min   Anion gap 6 5 - 15    Imaging Studies:    NA   Medications:  Scheduled  dicyclomine  10 mg Oral TID AC   docusate sodium  100 mg Oral Daily   ferrous sulfate  325 mg Oral QODAY   [START ON 04/11/2023] methadone  40 mg Oral Daily   prenatal multivitamin  1 tablet Oral Q1200   I have reviewed the patient's current medications.  ASSESSMENT: IUP 26 3/7 weeks No PNC Opoid drug abuse    PLAN: Stable. Starting Methadone. SW to see for assistance in Methadone clinic as out pt. Fetal well being reassuring. Complete Prenatal labs and tests. Continue routine antenatal care.   Kathy Pearson 04/10/2023,10:51 AM

## 2023-04-10 NOTE — MAU Note (Incomplete)
.  Kathy Pearson is a 32 y.o. at Unknown here in MAU reporting:  Detoxification, reports Dr. Crissie Reese sent her in for detoxification.   LMP:  Onset of complaint: *** Pain score: *** There were no vitals filed for this visit.   FHT:*** Lab orders placed from triage:

## 2023-04-11 ENCOUNTER — Other Ambulatory Visit: Payer: Self-pay

## 2023-04-11 DIAGNOSIS — R768 Other specified abnormal immunological findings in serum: Secondary | ICD-10-CM

## 2023-04-11 DIAGNOSIS — F1190 Opioid use, unspecified, uncomplicated: Secondary | ICD-10-CM

## 2023-04-11 DIAGNOSIS — Z3A26 26 weeks gestation of pregnancy: Secondary | ICD-10-CM

## 2023-04-11 DIAGNOSIS — Z8742 Personal history of other diseases of the female genital tract: Secondary | ICD-10-CM

## 2023-04-11 HISTORY — DX: Personal history of other diseases of the female genital tract: Z87.42

## 2023-04-11 LAB — RUBELLA SCREEN: Rubella: 12 index (ref >0.99)

## 2023-04-11 LAB — URINE CULTURE: Culture: >=100000 — AB

## 2023-04-11 MED ORDER — METRONIDAZOLE 500 MG PO TABS
500.0000 mg | ORAL_TABLET | Freq: Two times a day (BID) | ORAL | Status: DC
  Administered 2023-04-11 – 2023-04-13 (×5): 500 mg via ORAL
  Filled 2023-04-11 (×5): qty 1

## 2023-04-11 MED ORDER — METRONIDAZOLE 500 MG PO TABS: 500.0000 mg | ORAL_TABLET | Freq: Two times a day (BID) | ORAL | Status: DC

## 2023-04-11 MED ORDER — CEFADROXIL 500 MG PO CAPS
500.0000 mg | ORAL_CAPSULE | Freq: Two times a day (BID) | ORAL | Status: DC
  Administered 2023-04-11 – 2023-04-13 (×4): 500 mg via ORAL
  Filled 2023-04-11 (×6): qty 1

## 2023-04-11 MED ORDER — METHADONE HCL 10 MG PO TABS
30.0000 mg | ORAL_TABLET | Freq: Every day | ORAL | Status: DC
  Administered 2023-04-12: 30 mg via ORAL
  Filled 2023-04-11: qty 3

## 2023-04-11 NOTE — Progress Notes (Signed)
Strong cigarette smoke noted when entered room.  Discussed that this is a smoke free facility and the dangers of smoking in room.  Pt. Denies smoking in room.  Pt. States " I think its in my visitors clothing."

## 2023-04-11 NOTE — Progress Notes (Signed)
Patient ID: Kathy Pearson, female   DOB: 1991/04/19, 32 y.o.   MRN: [id number] ACULTY PRACTICE ANTEPARTUM COMPREHENSIVE PROGRESS NOTE  Kathy Pearson is a 32 y.o. [phone] at [redacted]w[redacted]d  who is admitted for for initiation of Methadone.   Fetal presentation is breech. Length of Stay:  1  Days  Subjective: Pt reports withdrawal Sx. Medication helps Patient reports good fetal movement.  She reports no uterine contractions, no bleeding and no loss of fluid per vagina.  Vitals:  Blood pressure 121/67, pulse 96, temperature 98 F (36.7 C), temperature source Oral, resp. rate 20, height 5\' 4"  (1.626 m), weight 60.9 kg, last menstrual period 10/27/2022, SpO2 98 %.  Physical Examination: Lungs clear Heart RRR Abd soft +bs gravid Ext non tender  Fetal Monitoring:  Baseline: 140 bpm, Variability: Good {> 6 bpm), Accelerations: Reactive, and Decelerations: Absent  Labs:  Results for orders placed or performed during the hospital encounter of 04/09/23 (from the past 24 hour(s))  Wet prep, genital   Collection Time: 04/10/23  2:20 PM  Result Value Ref Range   Yeast Wet Prep HPF POC NONE SEEN NONE SEEN   Trich, Wet Prep NONE SEEN NONE SEEN   Clue Cells Wet Prep HPF POC PRESENT (A) NONE SEEN   WBC, Wet Prep HPF POC >=10 (A) <10   Sperm NONE SEEN     Imaging Studies:    NA   Medications:  Scheduled  dicyclomine  10 mg Oral TID AC   docusate sodium  100 mg Oral Daily   ferrous sulfate  325 mg Oral QODAY   [START ON 04/12/2023] methadone  30 mg Oral Daily   prenatal multivitamin  1 tablet Oral Q1200   I have reviewed the patient's current medications.  ASSESSMENT: Patient Active Problem List   Diagnosis Date Noted   Hepatitis C antibody test positive 04/11/2023   Opioid use disorder 04/10/2023    PLAN: IUP 31 4/7 No PNC Opoid drug abuse Hep C  Stable. Continue with Methadone. Pt with known H/O Hep C. Fetal well being reassuring.  Continue routine antenatal care.   Hermina Staggers 04/11/2023,10:03 AM

## 2023-04-12 LAB — HCV RNA QUANT: HCV Quantitative: <15 IU/mL — ABNORMAL LOW (ref >50)

## 2023-04-12 LAB — GC/CHLAMYDIA PROBE AMP (~~LOC~~) NOT AT ARMC
Chlamydia: NEGATIVE
Comment: NEGATIVE
Comment: NORMAL
Neisseria Gonorrhea: NEGATIVE

## 2023-04-12 LAB — URINE CULTURE

## 2023-04-12 MED ORDER — METHADONE HCL 10 MG PO TABS
50.0000 mg | ORAL_TABLET | Freq: Every day | ORAL | Status: DC
  Administered 2023-04-13: 50 mg via ORAL
  Filled 2023-04-12: qty 5

## 2023-04-12 MED ORDER — METHADONE HCL 10 MG PO TABS
20.0000 mg | ORAL_TABLET | Freq: Once | ORAL | Status: AC
  Administered 2023-04-12: 20 mg via ORAL
  Filled 2023-04-12: qty 2

## 2023-04-12 NOTE — Progress Notes (Signed)
FACULTY PRACTICE ANTEPARTUM COMPREHENSIVE PROGRESS NOTE  Kathy Pearson is a 32 y.o. [phone] at [redacted]w[redacted]d who is admitted for methadone titration.  Estimated Date of Delivery: 07/14/23 Fetal presentation is breech.  Length of Stay:  2 Days. Admitted 04/09/2023  Subjective: Patient feeling better than when she arrived but feels like methadone is wearing out around 0500. Received 40 mg first few days she was here, only received 30 mg today. Living in [place], would like to go to a methadone clinic there. Does not have photo ID.    Vitals:  Blood pressure (!) 104/53, pulse 86, temperature 97.6 F (36.4 C), temperature source Oral, resp. rate 17, height 5\' 4"  (1.626 m), weight 60.9 kg, last menstrual period 10/27/2022, SpO2 99 %. Physical Exam Constitutional:      General: She is not in acute distress.    Appearance: Normal appearance. She is not ill-appearing.  HENT:     Head: Atraumatic.  Eyes:     General: No scleral icterus.    Conjunctiva/sclera: Conjunctivae normal.  Pulmonary:     Effort: Pulmonary effort is normal.  Skin:    General: Skin is warm and dry.     Coloration: Skin is not jaundiced or pale.  Neurological:     Mental Status: She is alert.     Coordination: Coordination normal.  Psychiatric:        Mood and Affect: Mood normal.        Behavior: Behavior normal.     NST done 04/11/2023 Fetal monitoring: FHR: 130 bpm, Variability: moderate, Accelerations: Present, Decelerations: Absent  Uterine activity: quiet  Results for orders placed or performed during the hospital encounter of 04/09/23 (from the past 48 hour(s))  Wet prep, genital     Status: Abnormal   Collection Time: 04/10/23  2:20 PM  Result Value Ref Range   Yeast Wet Prep HPF POC NONE SEEN NONE SEEN   Trich, Wet Prep NONE SEEN NONE SEEN   Clue Cells Wet Prep HPF POC PRESENT (A) NONE SEEN   WBC, Wet Prep HPF POC >=10 (A) <10   Sperm NONE SEEN     Comment: Performed at [facility], [address], [place], [place] [address]    No results found.  Current scheduled medications  cefadroxil  500 mg Oral BID   dicyclomine  10 mg Oral TID AC   docusate sodium  100 mg Oral Daily   ferrous sulfate  325 mg Oral QODAY   methadone  30 mg Oral Daily   metroNIDAZOLE  500 mg Oral Q12H   prenatal multivitamin  1 tablet Oral Q1200    I have reviewed the patient's current medications.  ASSESSMENT: Principal Problem:   Opioid use disorder Active Problems:   Hepatitis C antibody test positive   H/O abnormal cervical Papanicolaou smear   PLAN:  #OUD Increase methadone to 50 mg daily, I have placed orders [place] [facility] contacted by SW and they have intake availability for 04/14/23 Will plan to keep patient until tomorrow morning for next dose of methadone and then she is stable for discharge Patient needs to have discharge summary and last dose letter given to her at discharge I will coordinate an outpatient follow up visit in clinic with myself  #Hepatitis C Viral load still pending, follow up result.   Continue routine antenatal care.   Venora Maples, MD/MPH Attending Family Medicine Physician, [facility] for [facility], [facility]

## 2023-04-12 NOTE — Progress Notes (Signed)
CSW acknowledged consult and completed a clinical assessment.  There are no barriers to d/c.  Clinical assessment notes will be entered at a later time.   CSW contacted [facility] [phone]) where patient requested to go for follow up and spoke with staff member Tresa Endo). Staff reported that next intake day is Wednesday 5/15 at 5am. CSW updated patient, patient verbalized understanding and denied any barriers with getting treatment. Patient reported that she has adequate transportation to treatment. Patient reported not having a photo ID. CSW confirmed with staff what would be acceptable for patient to provide in place of photo ID. Staff reported that patient could provide a printed documented from the hospital with her name and picture. Staff reported that patient will also need her medicaid information and discharge paperwork. CSW updated patient, patient verbalized understanding.   CSW provided patient with printed document that could be used in place of patient's photo ID and insurance information as the treatment center requested. Patient thanked CSW and denied additional needs.   Celso Sickle, LCSW Clinical Social Worker [facility] Cell#: [phone]

## 2023-04-13 DIAGNOSIS — F1190 Opioid use, unspecified, uncomplicated: Secondary | ICD-10-CM | POA: Diagnosis not present

## 2023-04-13 DIAGNOSIS — Z3A29 29 weeks gestation of pregnancy: Secondary | ICD-10-CM | POA: Diagnosis not present

## 2023-04-13 MED ORDER — CEFADROXIL 500 MG PO CAPS: 500.0000 mg | ORAL_CAPSULE | Freq: Two times a day (BID) | ORAL | 0 refills | Status: DC

## 2023-04-13 MED ORDER — METRONIDAZOLE 500 MG PO TABS: 500.0000 mg | ORAL_TABLET | Freq: Two times a day (BID) | ORAL | 0 refills | Status: DC

## 2023-04-13 NOTE — Discharge Summary (Signed)
Physician Discharge Summary  Patient ID: Kathy Pearson MRN: [id number] DOB/AGE: February 24, 1991 32 y.o.  Admit date: 04/09/2023 Discharge date: 04/13/2023  Admission Diagnoses:[redacted] weeks gestation, opioid use disorder  Discharge Diagnoses:  Principal Problem:   Opioid use disorder Active Problems:   Hepatitis C antibody test positive   H/O abnormal cervical Papanicolaou smear   Discharged Condition: good  Hospital Course: Kathy Pearson is a 32 y.o. [phone] at approximately [redacted] weeks gestation who presents for detox from heroin. Patient reports she is using heroin injection daily and meth occasionally. She reports she last used a "20 bag" about 1 hour before arrival to MAU. She reports she used heroin in the past and was able to get clean using suboxone but it caused terrible withdrawals and she does not want to use that again. She is asking about methadone treatment.   She has not had any care in the pregnancy. She denies any abdominal pain.  She denies any vaginal bleeding, discharge, and leaking of fluid. Denies any constipation, diarrhea or any urinary complaints. Reports normal fetal movement.  She received Methadone to prevent withdrawal and Methadone treatment center was contacted for her to begin care after discharge on 5/15. Her sx were well controlled.   Consults:  Social work  Significant Diagnostic Studies: radiology: Ultrasound: MFM complete 26.3 weeks Treatments: IV hydration, Methadone  Discharge Exam: Blood pressure (!) 109/58, pulse 86, temperature 98.2 F (36.8 C), temperature source Oral, resp. rate 17, height 5\' 4"  (1.626 m), weight 60.9 kg, last menstrual period 10/27/2022, SpO2 99 %. General appearance: alert, cooperative, and no distress Resp: effort normal Abdomen gravid  Disposition: Discharge disposition: 01-Home or Self Care       Discharge Instructions     Discharge patient   Complete by: As directed    Discharge disposition: 01-Home or Self Care    Discharge patient date: 04/13/2023      Allergies as of 04/13/2023       Reactions   Clindamycin/lincomycin Anaphylaxis   Ultram [tramadol] Anaphylaxis        Medication List     TAKE these medications    cefadroxil 500 MG capsule Commonly known as: DURICEF Take 1 capsule (500 mg total) by mouth 2 (two) times daily.   metroNIDAZOLE 500 MG tablet Commonly known as: FLAGYL Take 1 tablet (500 mg total) by mouth every 12 (twelve) hours.   prenatal multivitamin Tabs tablet Take 1 tablet by mouth daily.        Follow-up Information     Center for [facility] Follow up.   Specialty: Obstetrics and Gynecology Contact information: [address] [place] [address] [phone]              5/28 with Crissie Reese  Signed: Scheryl Darter 04/13/2023, 9:38 AM

## 2023-04-13 NOTE — Clinical SW OB High Risk (Signed)
OB Specialty Care  Clinical Social Worker:  Antionette Poles, [place] Date/Time:  04/12/2023, 10:20 AM Gestational Age on Admission:  32 y.o. Admitting Diagnosis:     Expected Delivery Date:  07/14/23  [facility] Environment  Home Address:  [address]  [place], [place] [address] (Friend's address; temporary living situation)   Household Member/Support Name:   Relationship:   Friend Other Support:     Psychosocial Data  Employment:  Unemployed  Type of Work:    Education: Other (comment) (GED)   Strengths/Weaknesses/Factors to Consider  Concerns Related to Hospitalization:  Patient admitted for coordination of care and titration of methadone until care can be established at a methadone clinic.    Previous Pregnancies/Feelings Towards Pregnancy?  Concerns related to being/becoming a mother?:  Patient reported that she feels excited about pregnancy and plans to parent child.   Social Support (FOB? Who is/will be helping with baby/other kids?):  Patient named Carlyle Dolly) as FOB. Patient reported that her older children Tallis Durban 06/10/07; Miracles Amberson 10/03/08; Wilford Sports 12/23/10) reside with family and [facility] CPS was involved with the arrangement. Patient denied having any current open cases with CPS. Patient reported that she is in contact with her children.     Recent Stressful Life Events (life changes in past year?):  Patient reported having depressive symptoms a few months ago (October 2023) that led to relapse. Patient did elaborate on what triggered depressive symptoms at that time.    Prenatal Care/Education/Home Preparations: Patient denied any barriers to getting prenatal care and reported that she had just not initiated prenatal care yet. Patient to be set up with prenatal care prior to discharge. Patient denied any transportation barriers with getting to prenatal appointments.    Domestic Violence (of any type):  No If Yes to Domestic  Violence, Describe/Action Plan:     Substance Use During Pregnancy: Yes   Patient Advised/Response:  Patient started on Methadone during this admission and verbalized a plan to continue treatment. Patient reported that prior to being admitted she was using heroin on a daily basis. Patient reported to attending provider that she has been using on and off since the age of 64. Patient reported using Fentanyl via injection and shared that she has also used meth for "a little bit" but not everyday. Patient reported that she was "clean" for 6 1/2 years prior to relapsing. Patient reported last use of heroin on the day of admission. Patient verbalized a plan to follow up with a methadone clinic for treatment. Patient selected [facility] as it is close to where she is currently residing. Patient denied any transportation barriers with getting to treatment.    Clinical Assessment/Plan:   CSW met with patient at bedside and introduced self. Patient was accompanied by female friend and granted CSW verbal permission to speak in front of her friend about anything. CSW explained reason for consult. Patient appeared flat but remained engaged during assessment and answered all questions asked. Patient reported that she is currently living with her friend and shared that she plans to return at discharge. Patient denied any current housing concerns. CSW inquired about food insecurity, patient reported that she recertified for food stamps in [facility] and is waiting to hear back. Patient reported that she last made contact with them one week ago. Patient reported that she does not receive WIC but is interested in receiving the benefit. CSW agreed to complete referral, patient was agreeable. Patient denied needing any additional  resources. Patient reported that she has not started to shop for infant. CSW asked would any additional resources/supports be helpful, patient reported none at this time.    CSW inquired about patient's mental health history. Patient reported that she was diagnosed with Bipolar and Depression at age 64. Patient reported that she has taken medication in the past which were helpful. Patient was unable to recall which medications that she has taken in the past. Patient denied any history of auditory/visual hallucinations in relation to her Bipolar diagnosis. Patient denied any current symptoms of her mental health diagnoses and reported that she is not currently participating in any treatment for mental health. Patient reported that she would like to get back on medication. Patient reported that she knows where to follow up and denied needing any mental health resources. CSW inquired about how patient was feeling emotionally since being admitted to the hospital, patient reported that she was feeling okay. CSW assessed for safety, MOB denied SI, HI, and domestic violence.   CSW and patient discussed substance use and treatment which is mentioned in the previous section of the note.    Patient to follow up with [facility] for methadone treatment, patient denied needing any additional resources.   WIC referral completed.

## 2023-04-13 NOTE — Progress Notes (Signed)
CSW followed up with patient at bedside. CSW obtained patient's signature on patient request for access form in order to provide patient with needed documents for [facility]. Patient denied any additional needs. RN updated and to provide patient with documents prior to discharge.   Celso Sickle, LCSW Clinical Social Worker [facility] Cell#: [phone]

## 2023-04-14 ENCOUNTER — Inpatient Hospital Stay (HOSPITAL_COMMUNITY): Admit: 2023-04-14 | Payer: BLUE CROSS/BLUE SHIELD | Admitting: Obstetrics and Gynecology

## 2023-04-14 ENCOUNTER — Encounter: Payer: Self-pay | Admitting: Family Medicine

## 2023-04-14 ENCOUNTER — Inpatient Hospital Stay (HOSPITAL_COMMUNITY)
Admission: AD | Admit: 2023-04-14 | Discharge: 2023-04-15 | Disposition: A | Discharge status: Home / Self Care | Payer: BLUE CROSS/BLUE SHIELD | Attending: Obstetrics and Gynecology | Admitting: Obstetrics and Gynecology

## 2023-04-14 DIAGNOSIS — O99332 Smoking (tobacco) complicating pregnancy, second trimester: Secondary | ICD-10-CM | POA: Insufficient documentation

## 2023-04-14 DIAGNOSIS — F17210 Nicotine dependence, cigarettes, uncomplicated: Secondary | ICD-10-CM | POA: Insufficient documentation

## 2023-04-14 DIAGNOSIS — Z881 Allergy status to other antibiotic agents status: Secondary | ICD-10-CM | POA: Insufficient documentation

## 2023-04-14 DIAGNOSIS — F1120 Opioid dependence, uncomplicated: Secondary | ICD-10-CM | POA: Insufficient documentation

## 2023-04-14 DIAGNOSIS — F1190 Opioid use, unspecified, uncomplicated: Secondary | ICD-10-CM | POA: Diagnosis present

## 2023-04-14 DIAGNOSIS — O99322 Drug use complicating pregnancy, second trimester: Secondary | ICD-10-CM | POA: Insufficient documentation

## 2023-04-14 DIAGNOSIS — Z3A27 27 weeks gestation of pregnancy: Secondary | ICD-10-CM | POA: Insufficient documentation

## 2023-04-14 DIAGNOSIS — F419 Anxiety disorder, unspecified: Secondary | ICD-10-CM | POA: Insufficient documentation

## 2023-04-14 DIAGNOSIS — Z885 Allergy status to narcotic agent status: Secondary | ICD-10-CM | POA: Insufficient documentation

## 2023-04-14 DIAGNOSIS — O99342 Other mental disorders complicating pregnancy, second trimester: Secondary | ICD-10-CM | POA: Insufficient documentation

## 2023-04-15 ENCOUNTER — Encounter (HOSPITAL_COMMUNITY): Payer: Self-pay | Admitting: Obstetrics and Gynecology

## 2023-04-15 ENCOUNTER — Encounter: Payer: Self-pay | Admitting: Lactation Services

## 2023-04-15 DIAGNOSIS — F419 Anxiety disorder, unspecified: Secondary | ICD-10-CM | POA: Diagnosis not present

## 2023-04-15 DIAGNOSIS — Z3A27 27 weeks gestation of pregnancy: Secondary | ICD-10-CM | POA: Diagnosis not present

## 2023-04-15 DIAGNOSIS — F1190 Opioid use, unspecified, uncomplicated: Secondary | ICD-10-CM

## 2023-04-15 DIAGNOSIS — O99332 Smoking (tobacco) complicating pregnancy, second trimester: Secondary | ICD-10-CM | POA: Diagnosis not present

## 2023-04-15 DIAGNOSIS — Z881 Allergy status to other antibiotic agents status: Secondary | ICD-10-CM | POA: Diagnosis not present

## 2023-04-15 DIAGNOSIS — O99342 Other mental disorders complicating pregnancy, second trimester: Secondary | ICD-10-CM | POA: Diagnosis not present

## 2023-04-15 DIAGNOSIS — F1120 Opioid dependence, uncomplicated: Secondary | ICD-10-CM | POA: Diagnosis not present

## 2023-04-15 DIAGNOSIS — O99322 Drug use complicating pregnancy, second trimester: Secondary | ICD-10-CM | POA: Diagnosis not present

## 2023-04-15 DIAGNOSIS — F17210 Nicotine dependence, cigarettes, uncomplicated: Secondary | ICD-10-CM | POA: Diagnosis not present

## 2023-04-15 DIAGNOSIS — Z885 Allergy status to narcotic agent status: Secondary | ICD-10-CM | POA: Diagnosis not present

## 2023-04-15 MED ORDER — METHADONE HCL 10 MG PO TABS
50.0000 mg | ORAL_TABLET | Freq: Once | ORAL | Status: AC
  Administered 2023-04-15: 50 mg via ORAL
  Filled 2023-04-15: qty 5

## 2023-04-15 MED ORDER — HYDROXYZINE HCL 50 MG PO TABS: 50.0000 mg | ORAL_TABLET | Freq: Three times a day (TID) | ORAL | 0 refills | Status: AC | PRN

## 2023-04-15 NOTE — MAU Note (Addendum)
..  Kathy Pearson is a 32 y.o. at [redacted]w[redacted]d here in MAU reporting: patient is here for methadone  per Dr. Crissie Reese. She has not been able to get ride here until now.  +FM. Denies vaginal bleeding or leaking of fluid or ctx  Patient is also asking if she can be put back on her ambien for sleep and clonipine for anxiety.   Pain score: 8/10 bodyaches Vitals:   04/15/23 0105  BP: 118/76  Pulse: (!) 112  Resp: 16  Temp: 97.7 F (36.5 C)  SpO2: 98%     FHT:135 doppler

## 2023-04-15 NOTE — MAU Provider Note (Signed)
MAU Provider Note  History  [id number]  Arrival date and time: 04/14/23 2349   Chief Complaint  Patient presents with   Medication Refill     HPI Kathy Pearson is a 32 y.o. [phone] at [redacted]w[redacted]d by [place] with PMHx notable for OUD recently hospitalized and started on methadone, who presents for methadone dose for the day.   Vaginal bleeding: No LOF: No Fetal Movement: Yes Contractions: No  --/--/O POS (05/11 0132)  OB History     Gravida  4   Para  3   Term  0   Preterm  3   AB      Living  3      SAB      IAB      Ectopic      Multiple      Live Births  3           Past Medical History:  Diagnosis Date   Anxiety    Depression    Heroin abuse (HCC)     Past Surgical History:  Procedure Laterality Date   NO PAST SURGERIES      History reviewed. No pertinent family history.  Social History   Socioeconomic History   Marital status: Single    Spouse name: Not on file   Number of children: Not on file   Years of education: Not on file   Highest education level: Not on file  Occupational History   Not on file  Tobacco Use   Smoking status: Every Day    Types: Cigarettes   Smokeless tobacco: Not on file  Substance and Sexual Activity   Alcohol use: No   Drug use: Yes    Types: Heroin, Methamphetamines    Comment: meth use 2 days ago per patient; heroin use 04/10/23   Sexual activity: Yes    Birth control/protection: None    Comment: IC within past week  Other Topics Concern   Not on file  Social History Narrative   Not on file   Social Determinants of Health   Financial Resource Strain: Not on file  Food Insecurity: Food Insecurity Present (04/11/2023)   Hunger Vital Sign    Worried About Running Out of Food in the Last Year: Sometimes true    Ran Out of Food in the Last Year: Sometimes true  Transportation Needs: No Transportation Needs (04/11/2023)   PRAPARE - Administrator, Civil Service (Medical): No    Lack of  Transportation (Non-Medical): No  Physical Activity: Not on file  Stress: Not on file  Social Connections: Not on file  Intimate Partner Violence: Patient Declined (04/11/2023)   Humiliation, Afraid, Rape, and Kick questionnaire    Fear of Current or Ex-Partner: Patient declined    Emotionally Abused: Patient declined    Physically Abused: Patient declined    Sexually Abused: Patient declined    Allergies  Allergen Reactions   Clindamycin/Lincomycin Anaphylaxis   Ultram [Tramadol] Anaphylaxis    No current facility-administered medications on file prior to encounter.   Current Outpatient Medications on File Prior to Encounter  Medication Sig Dispense Refill   cefadroxil (DURICEF) 500 MG capsule Take 1 capsule (500 mg total) by mouth 2 (two) times daily. 16 capsule 0   metroNIDAZOLE (FLAGYL) 500 MG tablet Take 1 tablet (500 mg total) by mouth every 12 (twelve) hours. 12 tablet 0   Prenatal Vit-Fe Fumarate-FA (PRENATAL MULTIVITAMIN) TABS tablet Take 1 tablet by mouth daily.  Review of Systems  Constitutional:  Negative for appetite change, chills and fever.  HENT:  Negative for congestion, facial swelling and postnasal drip.   Eyes:  Negative for photophobia.  Respiratory:  Negative for cough and shortness of breath.   Cardiovascular:  Negative for chest pain.  Gastrointestinal:  Negative for abdominal pain, nausea and vomiting.  Endocrine: Negative for polyuria.  Genitourinary:  Negative for flank pain and pelvic pain.  Musculoskeletal:  Negative for arthralgias.  Skin:  Negative for rash.  Neurological:  Negative for weakness and light-headedness.  Psychiatric/Behavioral:  Negative for confusion.     Pertinent positives and negative per HPI, all others reviewed and negative  Physical Exam   BP 118/76 (BP Location: Right Arm)   Pulse (!) 112   Temp 97.7 F (36.5 C) (Oral)   Resp 16   Ht 5\' 4"  (1.626 m)   Wt 63.6 kg   LMP 10/27/2022 (Within Days)   SpO2 98%    BMI 24.07 kg/m   Patient Vitals for the past 24 hrs:  BP Temp Temp src Pulse Resp SpO2 Height Weight  04/15/23 0105 118/76 97.7 F (36.5 C) Oral (!) 112 16 98 % 5\' 4"  (1.626 m) 63.6 kg    Physical Exam Vitals reviewed.  Constitutional:      Appearance: Normal appearance.  HENT:     Head: Normocephalic and atraumatic.     Right Ear: External ear normal.     Left Ear: External ear normal.     Nose: Nose normal.     Mouth/Throat:     Pharynx: Oropharynx is clear.  Eyes:     Conjunctiva/sclera: Conjunctivae normal.  Cardiovascular:     Rate and Rhythm: Normal rate and regular rhythm.  Pulmonary:     Effort: Pulmonary effort is normal.     Breath sounds: Normal breath sounds.  Abdominal:     General: Abdomen is flat.     Palpations: Abdomen is soft.     Comments: Gravid  Genitourinary:    General: Normal vulva.     Vagina: No vaginal discharge.     Rectum: Normal.     Comments: SVE not done Skin:    General: Skin is warm.     Capillary Refill: Capillary refill takes less than 2 seconds.  Neurological:     Mental Status: Mental status is at baseline.  Psychiatric:        Mood and Affect: Mood normal.     Cervical Exam  Not done  Bedside Ultrasound not done  FHT Baseline 120, moderate variability, 15x15accels,  no decels Toco: none Cat: 1  Labs No results found for this or any previous visit (from the past 24 hour(s)).  Imaging No results found.  MAU Course  Procedures Lab Orders  No laboratory test(s) ordered today   Meds ordered this encounter  Medications   methadone (DOLOPHINE) tablet 50 mg   hydrOXYzine (ATARAX) 50 MG tablet    Sig: Take 1 tablet (50 mg total) by mouth 3 (three) times daily as needed for up to 15 days for anxiety.    Dispense:  15 tablet    Refill:  0   Imaging Orders  No imaging studies ordered today    MDM mild  Assessment and Plan  Opioid use disorder - Plan: Discharge patient  Anxiety  [redacted] weeks gestation of  pregnancy   Patient seen for opioid use disorder.  Patient was started on methadone therapy while hospitalized recently.  She missed her  appointment on 5/15, and therefore is here per Dr. Gaynell Face to receive her methadone dose.  Methadone 50 mg given.  Patient watched on fetal monitor afterwards with no complications. Patient complained of symptoms of anxiety.  Noted that she was receiving Klonopin and Ambien.  I discussed that patient suspicious that she is taking methadone and her risk of respiratory depression.  Gave her Atarax prn to use for anxiety until she establishes care at her first OB appointment.    Gave return and preterm labor precautions.  Follow-up with primary OB.  Dispo: discharged to home in stable condition.   Discharge Instructions     Discharge patient   Complete by: As directed    Discharge disposition: 01-Home or Self Care   Discharge patient date: 04/15/2023      Allergies as of 04/15/2023       Reactions   Clindamycin/lincomycin Anaphylaxis   Ultram [tramadol] Anaphylaxis        Medication List     TAKE these medications    cefadroxil 500 MG capsule Commonly known as: DURICEF Take 1 capsule (500 mg total) by mouth 2 (two) times daily.   hydrOXYzine 50 MG tablet Commonly known as: ATARAX Take 1 tablet (50 mg total) by mouth 3 (three) times daily as needed for up to 15 days for anxiety.   metroNIDAZOLE 500 MG tablet Commonly known as: FLAGYL Take 1 tablet (500 mg total) by mouth every 12 (twelve) hours.   prenatal multivitamin Tabs tablet Take 1 tablet by mouth daily.        Myrtie Hawk, DO FMOB Fellow, Faculty practice [facility], Center for [facility] 04/15/23  3:02 AM

## 2023-04-16 ENCOUNTER — Telehealth: Payer: BLUE CROSS/BLUE SHIELD | Admitting: Nurse Practitioner

## 2023-04-16 ENCOUNTER — Telehealth: Payer: Self-pay | Admitting: Pediatrics

## 2023-04-16 DIAGNOSIS — M545 Low back pain, unspecified: Secondary | ICD-10-CM | POA: Diagnosis not present

## 2023-04-16 NOTE — Telephone Encounter (Signed)
NAS consult via telephone following text messages from patient to [facility] NAS consult phone. Patient states she went to methadone clinic this morning and was told that there was not a doctor there until Monday and she would not be able to receive her methadone dose until Monday. Patient reports "stomach pains' that she feels are related to anxiety about not being able to dose this morning. Dr. Crissie Reese contacted with instructions received for patient to go to MAU for dosing.  Message relayed to patient verbally over the phone.  She plans to arrive at MAU around noon today and will text the NAS consult phone when she is on her way.

## 2023-04-17 ENCOUNTER — Encounter: Payer: Self-pay | Admitting: Family Medicine

## 2023-04-17 ENCOUNTER — Encounter (HOSPITAL_COMMUNITY): Payer: Self-pay | Admitting: Obstetrics and Gynecology

## 2023-04-17 ENCOUNTER — Inpatient Hospital Stay (HOSPITAL_COMMUNITY)
Admission: AD | Admit: 2023-04-17 | Discharge: 2023-04-18 | Disposition: A | Discharge status: Home / Self Care | Payer: BLUE CROSS/BLUE SHIELD | Attending: Obstetrics and Gynecology | Admitting: Obstetrics and Gynecology

## 2023-04-17 DIAGNOSIS — F1110 Opioid abuse, uncomplicated: Secondary | ICD-10-CM | POA: Diagnosis not present

## 2023-04-17 DIAGNOSIS — O99322 Drug use complicating pregnancy, second trimester: Secondary | ICD-10-CM | POA: Diagnosis not present

## 2023-04-17 DIAGNOSIS — F1190 Opioid use, unspecified, uncomplicated: Secondary | ICD-10-CM | POA: Diagnosis not present

## 2023-04-17 DIAGNOSIS — Z3A27 27 weeks gestation of pregnancy: Secondary | ICD-10-CM | POA: Diagnosis not present

## 2023-04-17 MED ORDER — NAPROXEN 500 MG PO TABS: 500.0000 mg | ORAL_TABLET | Freq: Two times a day (BID) | ORAL | 1 refills | Status: DC

## 2023-04-17 MED ORDER — METHADONE HCL 10 MG PO TABS
50.0000 mg | ORAL_TABLET | Freq: Once | ORAL | Status: AC
  Administered 2023-04-17: 50 mg via ORAL
  Filled 2023-04-17: qty 5

## 2023-04-17 MED ORDER — CYCLOBENZAPRINE HCL 10 MG PO TABS: 10.0000 mg | ORAL_TABLET | Freq: Three times a day (TID) | ORAL | 1 refills | Status: AC | PRN

## 2023-04-17 NOTE — MAU Provider Note (Signed)
MAU Provider Note  History  [id number]  Arrival date and time: 04/17/23 2245    HPI Kathy Pearson is a 32 y.o. [phone] at [redacted]w[redacted]d by [place] with PMHx notable for disorder, who presents for repeat methadone done. Pt was last seen in MAU  on 5.16 for methadone dose.  At that time she noted she was counseled by Dr. Crissie Reese to present herself in MAU to receive methadone dose if she is not able to receive it in her [place] methadone clinic.  Patient reappears today requesting another methadone dose given that she has not been able to establish care at her [facility] methadone clinic and was also given guidance by "nurse Boneta Lucks" to return today for repeat methadone dose and gas cards in order to get to MAU from [place].  Vaginal bleeding: No LOF: No Fetal Movement: No Contractions: No  --/--/O POS (05/11 0132)  OB History     Gravida  4   Para  3   Term  0   Preterm  3   AB      Living  3      SAB      IAB      Ectopic      Multiple      Live Births  3           Past Medical History:  Diagnosis Date   Anxiety    Depression    Heroin abuse (HCC)     Past Surgical History:  Procedure Laterality Date   NO PAST SURGERIES      History reviewed. No pertinent family history.  Social History   Socioeconomic History   Marital status: Single    Spouse name: Not on file   Number of children: Not on file   Years of education: Not on file   Highest education level: Not on file  Occupational History   Not on file  Tobacco Use   Smoking status: Every Day    Types: Cigarettes   Smokeless tobacco: Not on file  Substance and Sexual Activity   Alcohol use: No   Drug use: Yes    Types: Heroin, Methamphetamines    Comment: meth use 2 days ago per patient; heroin use 04/10/23   Sexual activity: Yes    Birth control/protection: None    Comment: IC within past week  Other Topics Concern   Not on file  Social History Narrative   Not on file   Social  Determinants of Health   Financial Resource Strain: Not on file  Food Insecurity: Food Insecurity Present (04/11/2023)   Hunger Vital Sign    Worried About Running Out of Food in the Last Year: Sometimes true    Ran Out of Food in the Last Year: Sometimes true  Transportation Needs: No Transportation Needs (04/11/2023)   PRAPARE - Administrator, Civil Service (Medical): No    Lack of Transportation (Non-Medical): No  Physical Activity: Not on file  Stress: Not on file  Social Connections: Not on file  Intimate Partner Violence: Patient Declined (04/11/2023)   Humiliation, Afraid, Rape, and Kick questionnaire    Fear of Current or Ex-Partner: Patient declined    Emotionally Abused: Patient declined    Physically Abused: Patient declined    Sexually Abused: Patient declined    Allergies  Allergen Reactions   Clindamycin/Lincomycin Anaphylaxis   Ultram [Tramadol] Anaphylaxis    No current facility-administered medications on file prior to encounter.   Current Outpatient Medications  on File Prior to Encounter  Medication Sig Dispense Refill   cefadroxil (DURICEF) 500 MG capsule Take 1 capsule (500 mg total) by mouth 2 (two) times daily. 16 capsule 0   metroNIDAZOLE (FLAGYL) 500 MG tablet Take 1 tablet (500 mg total) by mouth every 12 (twelve) hours. 12 tablet 0   Prenatal Vit-Fe Fumarate-FA (PRENATAL MULTIVITAMIN) TABS tablet Take 1 tablet by mouth daily.       Review of Systems  Constitutional:  Negative for appetite change, chills and fever.  HENT:  Negative for congestion, facial swelling and postnasal drip.   Eyes:  Negative for photophobia.  Respiratory:  Negative for cough and shortness of breath.   Cardiovascular:  Negative for chest pain.  Gastrointestinal:  Negative for abdominal pain, nausea and vomiting.  Endocrine: Negative for polyuria.  Genitourinary:  Negative for flank pain and pelvic pain.  Musculoskeletal:  Negative for arthralgias.  Skin:   Negative for rash.  Neurological:  Negative for weakness and light-headedness.  Psychiatric/Behavioral:  Negative for confusion.     Pertinent positives and negative per HPI, all others reviewed and negative  Physical Exam   BP 110/74   Pulse (!) 107   Temp 98 F (36.7 C)   Resp 18   Ht 5\' 4"  (1.626 m)   Wt 63.7 kg   LMP 10/27/2022 (Within Days)   BMI 24.12 kg/m   Patient Vitals for the past 24 hrs:  BP Temp Pulse Resp Height Weight  04/17/23 2317 110/74 98 F (36.7 C) (!) 107 18 5\' 4"  (1.626 m) 63.7 kg    Physical Exam Vitals reviewed.  Constitutional:      Appearance: Normal appearance.  HENT:     Head: Normocephalic and atraumatic.     Right Ear: External ear normal.     Left Ear: External ear normal.  Cardiovascular:     Rate and Rhythm: Normal rate and regular rhythm.  Pulmonary:     Effort: Pulmonary effort is normal.     Breath sounds: Normal breath sounds.  Abdominal:     General: Abdomen is flat.     Palpations: Abdomen is soft.  Skin:    General: Skin is warm.     Capillary Refill: Capillary refill takes less than 2 seconds.  Psychiatric:        Mood and Affect: Mood normal.     Bedside Ultrasound not done  FHT Baseline 140, moderate variability, 15 x 15 accels, none decels Toco: None Cat: 1  Labs No results found for this or any previous visit (from the past 24 hour(s)).  Imaging No results found.  MAU Course  Procedures Lab Orders  No laboratory test(s) ordered today   Meds ordered this encounter  Medications   methadone (DOLOPHINE) tablet 50 mg   Imaging Orders  No imaging studies ordered today    MDM moderate  Assessment and Plan  OUD [redacted] weeks gestation   Pt returned today for repeat methadone dose. She did not get her methadone dose yesterday, last dose was 5.16/24 when she was last here in MAU. She received her methadone dose 50mg , then reactive NST for ~12min. Pt needed transportation resources to return tomorrow for  a repeat methadone dose, however she lives in [place] and the taxi/bus pass available are not useful in [place].  Patient will use personal resources to return to MAU during 8-5 PM on 5/19 for repeat methadone dose and social work consult to obtain transportation resources.   Gave return and preterm  labor precautions.  Follow-up with primary OB.  Dispo: discharged to home in stable condition.   Discharge Instructions     Discharge patient   Complete by: As directed    Discharge disposition: 01-Home or Self Care   Discharge patient date: 04/17/2023      Allergies as of 04/17/2023       Reactions   Clindamycin/lincomycin Anaphylaxis   Ultram [tramadol] Anaphylaxis        Medication List     STOP taking these medications    naproxen 500 MG tablet Commonly known as: Naprosyn       TAKE these medications    cefadroxil 500 MG capsule Commonly known as: DURICEF Take 1 capsule (500 mg total) by mouth 2 (two) times daily.   cyclobenzaprine 10 MG tablet Commonly known as: FLEXERIL Take 1 tablet (10 mg total) by mouth 3 (three) times daily as needed for muscle spasms.   hydrOXYzine 50 MG tablet Commonly known as: ATARAX Take 1 tablet (50 mg total) by mouth 3 (three) times daily as needed for up to 15 days for anxiety.   metroNIDAZOLE 500 MG tablet Commonly known as: FLAGYL Take 1 tablet (500 mg total) by mouth every 12 (twelve) hours.   prenatal multivitamin Tabs tablet Take 1 tablet by mouth daily.        Myrtie Hawk, DO FMOB Fellow, Faculty practice [facility], Center for [facility] 04/17/23  11:46 PM

## 2023-04-17 NOTE — Progress Notes (Signed)

## 2023-04-18 ENCOUNTER — Encounter (HOSPITAL_COMMUNITY): Payer: Self-pay | Admitting: Obstetrics & Gynecology

## 2023-04-18 ENCOUNTER — Other Ambulatory Visit: Payer: Self-pay

## 2023-04-18 ENCOUNTER — Inpatient Hospital Stay (EMERGENCY_DEPARTMENT_HOSPITAL)
Admission: AD | Admit: 2023-04-18 | Discharge: 2023-04-18 | Disposition: A | Discharge status: Home / Self Care | Payer: BLUE CROSS/BLUE SHIELD | Source: Home / Self Care | Attending: Obstetrics & Gynecology | Admitting: Obstetrics & Gynecology

## 2023-04-18 DIAGNOSIS — O99322 Drug use complicating pregnancy, second trimester: Secondary | ICD-10-CM | POA: Insufficient documentation

## 2023-04-18 DIAGNOSIS — F1110 Opioid abuse, uncomplicated: Secondary | ICD-10-CM | POA: Insufficient documentation

## 2023-04-18 DIAGNOSIS — F1190 Opioid use, unspecified, uncomplicated: Secondary | ICD-10-CM

## 2023-04-18 DIAGNOSIS — Z3490 Encounter for supervision of normal pregnancy, unspecified, unspecified trimester: Secondary | ICD-10-CM

## 2023-04-18 DIAGNOSIS — Z3A27 27 weeks gestation of pregnancy: Secondary | ICD-10-CM | POA: Insufficient documentation

## 2023-04-18 MED ORDER — NALOXONE HCL 4 MG/0.1ML NA LIQD: 1.0000 | Freq: Once | NASAL | 1 refills | Status: AC

## 2023-04-18 MED ORDER — METHADONE HCL 5 MG PO TABS
55.0000 mg | ORAL_TABLET | Freq: Once | ORAL | Status: AC
  Administered 2023-04-18: 55 mg via ORAL
  Filled 2023-04-18: qty 1

## 2023-04-18 NOTE — MAU Note (Signed)
.  Kathy Pearson is a 32 y.o. at [redacted]w[redacted]d here in MAU reporting: she is here for repeat dose of methadone.  Reports some irreg ctx, denies lof or vag bleeding.    Pain score: 7 Vitals:   04/18/23 1642  BP: 120/78  Pulse: (!) 114  Resp: 16  Temp: 97.9 F (36.6 C)  SpO2: 98%     FHT:135 Lab orders placed from triage:

## 2023-04-18 NOTE — Discharge Instructions (Signed)
Continue with current plan.  Please follow up with [facility] clinic as scheduled

## 2023-04-18 NOTE — MAU Provider Note (Signed)
History     [id number]  Arrival date and time: 04/18/23 1617    Chief Complaint  Patient presents with   Abdominal Pain     HPI Kathy Pearson is a 32 y.o. at [redacted]w[redacted]d by midtrimester ultrasound with PMHx notable for OUD with history of history of polysubstance abuse, who presents for methadone treatment.   Patient was previously admitted for methadone induction with plans to follow-up at a clinic in [place].  Today she reports that the clinic only excepts new patients on Wednesdays.  She is therefore without her methadone until she can establish care at this clinic.  She denies symptoms of withdrawal however does report moderate cravings.    Denies vaginal bleeding, discharge or loss of fluid.  Denies contractions.  Notes good fetal movement Past Medical History:  Diagnosis Date   Anxiety    Depression    Heroin abuse (HCC)     Past Surgical History:  Procedure Laterality Date   NO PAST SURGERIES      No family history on file.  Allergies  Allergen Reactions   Clindamycin/Lincomycin Anaphylaxis   Ultram [Tramadol] Anaphylaxis    No current facility-administered medications on file prior to encounter.   Current Outpatient Medications on File Prior to Encounter  Medication Sig Dispense Refill   cefadroxil (DURICEF) 500 MG capsule Take 1 capsule (500 mg total) by mouth 2 (two) times daily. 16 capsule 0   cyclobenzaprine (FLEXERIL) 10 MG tablet Take 1 tablet (10 mg total) by mouth 3 (three) times daily as needed for muscle spasms. 30 tablet 1   hydrOXYzine (ATARAX) 50 MG tablet Take 1 tablet (50 mg total) by mouth 3 (three) times daily as needed for up to 15 days for anxiety. 15 tablet 0   methadone (DOLOPHINE) 10 MG tablet Take 50 mg by mouth daily.     metroNIDAZOLE (FLAGYL) 500 MG tablet Take 1 tablet (500 mg total) by mouth every 12 (twelve) hours. 12 tablet 0   Prenatal Vit-Fe Fumarate-FA (PRENATAL MULTIVITAMIN) TABS tablet Take 1 tablet by mouth daily.        ROS Pertinent positives and negative per HPI, all others reviewed and negative  Physical Exam   BP 118/86 (BP Location: Right Arm)   Pulse 98   Temp 97.9 F (36.6 C) (Oral)   Resp 16   Wt 64 kg   LMP 10/27/2022 (Within Days)   SpO2 98%   BMI 24.24 kg/m   Patient Vitals for the past 24 hrs:  BP Temp Temp src Pulse Resp SpO2 Weight  04/18/23 1827 118/86 -- -- 98 16 -- --  04/18/23 1642 120/78 97.9 F (36.6 C) Oral (!) 114 16 98 % 64 kg    Physical Exam Constitutional:      Appearance: She is well-developed.  HENT:     Head: Normocephalic.  Cardiovascular:     Heart sounds: Normal heart sounds.  Pulmonary:     Effort: No respiratory distress.     Breath sounds: Normal breath sounds. No wheezing or rales.  Abdominal:     Comments: Gravid, soft and nontender  Musculoskeletal:        General: No swelling or tenderness.     Cervical back: Normal range of motion.  Skin:    General: Skin is warm and dry.  Neurological:     Mental Status: She is alert and oriented to person, place, and time.  Psychiatric:        Mood and Affect: Mood normal.  Behavior: Behavior normal.      Cervical Exam  Not indicated  FHT Baseline 130, moderate variability, + accels, absent decels Toco: no contractions Cat: I - reactive  Labs No results found for this or any previous visit (from the past 24 hour(s)).  Imaging No results found.  MAU Course  Procedures Lab Orders  No laboratory test(s) ordered today   Meds ordered this encounter  Medications   naloxone (NARCAN) nasal spray 4 mg/0.1 mL    Sig: Place 1 spray into the nose once for 1 dose.    Dispense:  1 g    Refill:  1   methadone (DOLOPHINE) tablet 55 mg   Imaging Orders  No imaging studies ordered today    MDM -intake complete as above -reactive NST -increased Methadone to 55mg  daily Assessment and Plan   1. Opioid use disorder   2. Intrauterine pregnancy    -reactive NST -plan to continue  with methadone 55mg  daily -for now, until pt can establish care at [facility], pt receiving Methadone through MAU.   -pt has Narcan at home -encouraged pt to continue to reach out daily to [facility] clinic -follow up for routine OB visit as scheduled  Sharon Seller, DO 04/18/23 6:28 PM

## 2023-04-19 ENCOUNTER — Inpatient Hospital Stay (HOSPITAL_COMMUNITY)
Admission: AD | Admit: 2023-04-19 | Discharge: 2023-04-19 | Disposition: A | Discharge status: Home / Self Care | Payer: BLUE CROSS/BLUE SHIELD | Attending: Obstetrics and Gynecology | Admitting: Obstetrics and Gynecology

## 2023-04-19 DIAGNOSIS — F1190 Opioid use, unspecified, uncomplicated: Secondary | ICD-10-CM | POA: Diagnosis not present

## 2023-04-19 DIAGNOSIS — Z79891 Long term (current) use of opiate analgesic: Secondary | ICD-10-CM | POA: Diagnosis not present

## 2023-04-19 DIAGNOSIS — Z3A27 27 weeks gestation of pregnancy: Secondary | ICD-10-CM | POA: Insufficient documentation

## 2023-04-19 DIAGNOSIS — F1120 Opioid dependence, uncomplicated: Secondary | ICD-10-CM | POA: Diagnosis not present

## 2023-04-19 DIAGNOSIS — O99342 Other mental disorders complicating pregnancy, second trimester: Secondary | ICD-10-CM | POA: Insufficient documentation

## 2023-04-19 DIAGNOSIS — F1910 Other psychoactive substance abuse, uncomplicated: Secondary | ICD-10-CM | POA: Insufficient documentation

## 2023-04-19 DIAGNOSIS — O2340 Unspecified infection of urinary tract in pregnancy, unspecified trimester: Secondary | ICD-10-CM | POA: Diagnosis present

## 2023-04-19 HISTORY — DX: Unspecified infection of urinary tract in pregnancy, unspecified trimester: O23.40

## 2023-04-19 MED ORDER — METHADONE HCL 5 MG PO TABS
55.0000 mg | ORAL_TABLET | Freq: Once | ORAL | Status: AC
  Administered 2023-04-19: 55 mg via ORAL
  Filled 2023-04-19: qty 1

## 2023-04-19 NOTE — MAU Note (Signed)
.  Kathy Pearson is a 32 y.o. at [redacted]w[redacted]d here in MAU reporting: pt states she was sent by Dr. Crissie Reese to receive her methadone dose. Pr endorses FM. Pt denies VB, LOF, PIH s/s.  Onset of complaint: n/a Pain score: 2/10 Pelvic pain ongoing, not newly onset Vitals:   04/19/23 2008  BP: 107/64  Pulse: 98  Resp: 18  Temp: 97.7 F (36.5 C)  SpO2: 98%     FHT:125 Lab orders placed from triage:

## 2023-04-19 NOTE — MAU Provider Note (Signed)
MAU provider note  History     [id number]  Arrival date and time: 04/19/23 1941    CC: daily methadone dosing  HPI Kathy Pearson is a 32 y.o. at [redacted]w[redacted]d by midtrimester ultrasound with PMHx notable for OUD with history of history of polysubstance abuse, who presents for methadone treatment, hepC.   Patient was previously admitted for methadone induction with plans to follow-up at a clinic in [place].  Previously, she reported that the clinic only excepts new patients on Wednesdays.  She is therefore without her methadone until she can establish care at this clinic.  She denies symptoms of withdrawal.   Denies vaginal bleeding, discharge or loss of fluid.  Denies contractions.  Notes good fetal movement Past Medical History:  Diagnosis Date   Anxiety    Depression    Heroin abuse (HCC)     Past Surgical History:  Procedure Laterality Date   NO PAST SURGERIES      No family history on file.  Allergies  Allergen Reactions   Clindamycin/Lincomycin Anaphylaxis   Ultram [Tramadol] Anaphylaxis    No current facility-administered medications on file prior to encounter.   Current Outpatient Medications on File Prior to Encounter  Medication Sig Dispense Refill   cefadroxil (DURICEF) 500 MG capsule Take 1 capsule (500 mg total) by mouth 2 (two) times daily. 16 capsule 0   cyclobenzaprine (FLEXERIL) 10 MG tablet Take 1 tablet (10 mg total) by mouth 3 (three) times daily as needed for muscle spasms. 30 tablet 1   hydrOXYzine (ATARAX) 50 MG tablet Take 1 tablet (50 mg total) by mouth 3 (three) times daily as needed for up to 15 days for anxiety. 15 tablet 0   methadone (DOLOPHINE) 10 MG tablet Take 50 mg by mouth daily.     metroNIDAZOLE (FLAGYL) 500 MG tablet Take 1 tablet (500 mg total) by mouth every 12 (twelve) hours. 12 tablet 0   Prenatal Vit-Fe Fumarate-FA (PRENATAL MULTIVITAMIN) TABS tablet Take 1 tablet by mouth daily.     ROS Pertinent positives and negative per HPI, all  others reviewed and negative  Physical Exam   BP 107/64 (BP Location: Right Arm)   Pulse 98   Temp 97.7 F (36.5 C) (Oral)   Resp 18   LMP 10/27/2022 (Within Days)   SpO2 98%   Patient Vitals for the past 24 hrs:  BP Temp Temp src Pulse Resp SpO2  04/19/23 2008 107/64 97.7 F (36.5 C) Oral 98 18 98 %     Physical Exam Constitutional:      Appearance: She is well-developed.  HENT:     Head: Normocephalic.  Pulmonary:     Effort: Pulmonary effort is normal. No respiratory distress.     Breath sounds: No wheezing or rales.  Abdominal:     Comments: Gravid, soft and nontender  Musculoskeletal:        General: No swelling or tenderness.     Cervical back: Normal range of motion.  Neurological:     Mental Status: She is alert and oriented to person, place, and time.  Psychiatric:        Mood and Affect: Mood normal.        Behavior: Behavior normal.     Cervical Exam  Not indicated  FHT pending  Labs No results found for this or any previous visit (from the past 24 hour(s)).  Imaging No results found.  MAU Course  Procedures Lab Orders  No laboratory test(s) ordered today  Meds ordered this encounter  Medications   methadone (DOLOPHINE) tablet 55 mg   Imaging Orders  No imaging studies ordered today    MDM -intake complete as above -Continue Methadone to 55mg  daily (confirmed with dr. Crissie Reese) Assessment and Plan   -for now, until pt can establish care at [facility], pt receiving Methadone through MAU.   -pt has Narcan at home -encouraged pt to continue to reach out daily to [facility] clinic -follow up for routine OB visit as scheduled  [place] Bing, MD 04/19/23 8:44 PM

## 2023-04-21 ENCOUNTER — Encounter: Payer: Self-pay | Admitting: Family Medicine

## 2023-04-27 ENCOUNTER — Encounter: Payer: BLUE CROSS/BLUE SHIELD | Admitting: Family Medicine

## 2023-04-29 ENCOUNTER — Telehealth: Payer: BLUE CROSS/BLUE SHIELD | Admitting: Family Medicine

## 2023-04-29 ENCOUNTER — Encounter: Payer: Self-pay | Admitting: Family Medicine

## 2023-04-29 DIAGNOSIS — F1190 Opioid use, unspecified, uncomplicated: Secondary | ICD-10-CM

## 2023-04-29 DIAGNOSIS — Z3A29 29 weeks gestation of pregnancy: Secondary | ICD-10-CM

## 2023-04-29 NOTE — Progress Notes (Signed)
[place]   Needs to be seen in person for pregnancy with possible infection. Notes to have taken percocet for pain- has been trying to get treatment for opioid use during pregnancy.  Needs in person eval.

## 2023-05-04 ENCOUNTER — Other Ambulatory Visit: Payer: Self-pay

## 2023-05-04 ENCOUNTER — Encounter: Payer: Self-pay | Admitting: Family Medicine

## 2023-05-04 ENCOUNTER — Inpatient Hospital Stay (HOSPITAL_COMMUNITY)
Admission: AD | Admit: 2023-05-04 | Discharge: 2023-05-04 | Disposition: A | Discharge status: Home / Self Care | Payer: BLUE CROSS/BLUE SHIELD | Attending: Obstetrics and Gynecology | Admitting: Obstetrics and Gynecology

## 2023-05-04 ENCOUNTER — Ambulatory Visit (INDEPENDENT_AMBULATORY_CARE_PROVIDER_SITE_OTHER): Payer: BLUE CROSS/BLUE SHIELD | Admitting: Family Medicine

## 2023-05-04 VITALS — BP 110/73 | HR 91 | Wt 145.0 lb

## 2023-05-04 DIAGNOSIS — F1190 Opioid use, unspecified, uncomplicated: Secondary | ICD-10-CM

## 2023-05-04 DIAGNOSIS — F17210 Nicotine dependence, cigarettes, uncomplicated: Secondary | ICD-10-CM | POA: Insufficient documentation

## 2023-05-04 DIAGNOSIS — O99323 Drug use complicating pregnancy, third trimester: Secondary | ICD-10-CM | POA: Diagnosis not present

## 2023-05-04 DIAGNOSIS — O212 Late vomiting of pregnancy: Secondary | ICD-10-CM | POA: Insufficient documentation

## 2023-05-04 DIAGNOSIS — R768 Other specified abnormal immunological findings in serum: Secondary | ICD-10-CM

## 2023-05-04 DIAGNOSIS — O99013 Anemia complicating pregnancy, third trimester: Secondary | ICD-10-CM

## 2023-05-04 DIAGNOSIS — Z3A29 29 weeks gestation of pregnancy: Secondary | ICD-10-CM | POA: Insufficient documentation

## 2023-05-04 DIAGNOSIS — O0993 Supervision of high risk pregnancy, unspecified, third trimester: Secondary | ICD-10-CM | POA: Diagnosis not present

## 2023-05-04 DIAGNOSIS — Z8751 Personal history of pre-term labor: Secondary | ICD-10-CM

## 2023-05-04 DIAGNOSIS — F1193 Opioid use, unspecified with withdrawal: Secondary | ICD-10-CM | POA: Insufficient documentation

## 2023-05-04 DIAGNOSIS — O99320 Drug use complicating pregnancy, unspecified trimester: Secondary | ICD-10-CM

## 2023-05-04 DIAGNOSIS — O2343 Unspecified infection of urinary tract in pregnancy, third trimester: Secondary | ICD-10-CM

## 2023-05-04 DIAGNOSIS — O99333 Smoking (tobacco) complicating pregnancy, third trimester: Secondary | ICD-10-CM | POA: Insufficient documentation

## 2023-05-04 DIAGNOSIS — F419 Anxiety disorder, unspecified: Secondary | ICD-10-CM | POA: Diagnosis not present

## 2023-05-04 DIAGNOSIS — Z8742 Personal history of other diseases of the female genital tract: Secondary | ICD-10-CM

## 2023-05-04 MED ORDER — METHADONE HCL 5 MG PO TABS: 55.0000 mg | ORAL_TABLET | Freq: Once | ORAL | Status: DC

## 2023-05-04 MED ORDER — METHADONE HCL 5 MG PO TABS
5.0000 mg | ORAL_TABLET | Freq: Once | ORAL | Status: AC
  Administered 2023-05-04: 5 mg via ORAL
  Filled 2023-05-04: qty 1

## 2023-05-04 MED ORDER — TRAZODONE HCL 50 MG PO TABS
50.0000 mg | ORAL_TABLET | Freq: Every evening | ORAL | 1 refills | Status: AC | PRN
Start: 2023-05-04 — End: ?

## 2023-05-04 MED ORDER — ESCITALOPRAM OXALATE 10 MG PO TABS
10.0000 mg | ORAL_TABLET | Freq: Every day | ORAL | 2 refills | Status: AC
Start: 2023-05-04 — End: ?

## 2023-05-04 MED ORDER — METHADONE HCL 10 MG PO TABS
50.0000 mg | ORAL_TABLET | Freq: Once | ORAL | Status: AC
  Administered 2023-05-04: 50 mg via ORAL
  Filled 2023-05-04: qty 5

## 2023-05-04 MED ORDER — ONDANSETRON 4 MG PO TBDP: 4.0000 mg | ORAL_TABLET | Freq: Four times a day (QID) | ORAL | 0 refills | Status: AC | PRN

## 2023-05-04 NOTE — Discharge Instructions (Signed)
Please keep your intake in [place] so you can stay on your maintenance dose of methadone.

## 2023-05-04 NOTE — MAU Note (Signed)
Kathy Pearson is a 32 y.o. at [redacted]w[redacted]d here in MAU reporting: sent to MAU by Dr. Crissie Reese to receive Methadone. Denies VB or LOF.  Reports +FM. LMP: 10/27/2022 Onset of complaint: today Pain score: 0 Vitals:   05/04/23 1302  BP: 106/64  Pulse: 89  Resp: 18  Temp: 97.8 F (36.6 C)  SpO2: 97%     FHT:  142 bpm Lab orders placed from triage:  None

## 2023-05-04 NOTE — Patient Instructions (Signed)

## 2023-05-04 NOTE — MAU Provider Note (Signed)
History     CSN: [id number]  Arrival date and time: 05/04/23 1253    Chief Complaint  Patient presents with   Methadone Dose   HPI Patient is 32 y.o. [id number] [redacted]w[redacted]d here with complaints of mild withdrawal sx. Seen in clinic and sent to MAU for methadone dosing. Last used 6/3 at 2000. Reported nausea/vomiting to Dr. Crissie Reese. She reports she has her intake at [facility].   +FM, denies LOF, VB, contractions, vaginal discharge.   OB History     Gravida  4   Para  3   Term  0   Preterm  3   AB  0   Living  3      SAB  0   IAB  0   Ectopic  0   Multiple  0   Live Births  3           Past Medical History:  Diagnosis Date   Anxiety    Depression    Heroin abuse (HCC)     Past Surgical History:  Procedure Laterality Date   NO PAST SURGERIES      No family history on file.  Social History   Tobacco Use   Smoking status: Every Day    Packs/day: .5    Types: Cigarettes  Vaping Use   Vaping Use: Every day  Substance Use Topics   Alcohol use: No   Drug use: Yes    Types: Heroin, Methamphetamines    Comment: meth use 2 days ago per patient; heroin use 04/10/23    Allergies:  Allergies  Allergen Reactions   Clindamycin/Lincomycin Anaphylaxis   Ultram [Tramadol] Anaphylaxis   Hydrocodone-Acetaminophen Hives    Medications Prior to Admission  Medication Sig Dispense Refill Last Dose   cyclobenzaprine (FLEXERIL) 10 MG tablet Take 1 tablet (10 mg total) by mouth 3 (three) times daily as needed for muscle spasms. 30 tablet 1    escitalopram (LEXAPRO) 10 MG tablet Take 1 tablet (10 mg total) by mouth daily. 30 tablet 2    methadone (DOLOPHINE) 10 MG tablet Take 50 mg by mouth daily. (Patient not taking: Reported on 05/04/2023)      Prenatal Vit-Fe Fumarate-FA (PRENATAL MULTIVITAMIN) TABS tablet Take 1 tablet by mouth daily.      traZODone (DESYREL) 50 MG tablet Take 1 tablet (50 mg total) by mouth at bedtime as needed for sleep. 30 tablet 1      Review of Systems  Constitutional:  Negative for chills and fever.  HENT:  Negative for congestion and sore throat.   Eyes:  Negative for pain and visual disturbance.  Respiratory:  Negative for cough, chest tightness and shortness of breath.   Cardiovascular:  Negative for chest pain.  Gastrointestinal:  Positive for nausea and vomiting. Negative for abdominal pain and diarrhea.  Endocrine: Negative for cold intolerance and heat intolerance.  Genitourinary:  Negative for dysuria and flank pain.  Musculoskeletal:  Negative for back pain.  Skin:  Negative for rash.  Allergic/Immunologic: Negative for food allergies.  Neurological:  Negative for dizziness and light-headedness.  Psychiatric/Behavioral:  Negative for agitation.    Physical Exam   Blood pressure 106/64, pulse 89, temperature 97.8 F (36.6 C), temperature source Oral, resp. rate 18, height 5\' 4"  (1.626 m), weight 65.1 kg, last menstrual period 10/27/2022, SpO2 97 %.  Physical Exam Vitals and nursing note reviewed.  Constitutional:      Appearance: Normal appearance.  HENT:     Head: Normocephalic and  atraumatic.     Nose: Nose normal.     Mouth/Throat:     Mouth: Mucous membranes are moist.  Eyes:     Conjunctiva/sclera: Conjunctivae normal.  Cardiovascular:     Rate and Rhythm: Normal rate.  Pulmonary:     Effort: Pulmonary effort is normal.  Abdominal:     General: Abdomen is flat.     Palpations: Abdomen is soft.     Comments: Gravid -29cm  Musculoskeletal:     Cervical back: Normal range of motion.  Skin:    General: Skin is warm.     Capillary Refill: Capillary refill takes less than 2 seconds.  Neurological:     General: No focal deficit present.     Mental Status: She is alert.  Psychiatric:        Mood and Affect: Mood normal.     MAU Course  Procedures  MDM- low  Here today due to needing methadone dosing. Has intake tomorrow at [facility] for continued medications. Having mild  withdrawal sx.   Gas Card for 10$ given today  Coordinated care with Dr Crissie Reese  Assessment and Plan   1. Opiate withdrawal (HCC)   2. Heroin use affecting pregnancy   3. Opioid use disorder   4. Hepatitis C antibody test positive    - Observed patient taking Methadone 55mg   - Reported no pain  - Zofran ODT sent to pharmacy - Given gas card  - has follow up appts scheduled  Future Appointments  Date Time Provider Department Center  05/10/2023  3:45 PM [facility] US6 [facility]  05/21/2023  8:20 AM [facility] LAB [facility]  05/21/2023  9:15 AM Venora Maples, MD [facility]     Kathy Pearson 05/04/2023, 3:04 PM

## 2023-05-04 NOTE — Progress Notes (Signed)
Subjective:   Kathy Pearson is a 32 y.o. [phone] at [redacted]w[redacted]d by 26 wk [place] being seen today for her first obstetrical visit.  Her obstetrical history is significant for  opioid use disorder, late to prenatal care, hx of preterm delivery, hepatitis C . Patient does intend to breast feed. Pregnancy history fully reviewed.  Patient reports  feeling sick from withdrawal symptoms .  HISTORY: OB History  Gravida Para Term Preterm AB Living  4 3 0 3 0 3  SAB IAB Ectopic Multiple Live Births  0 0 0 0 3    # Outcome Date GA Lbr Len/2nd Weight Sex Delivery Anes PTL Lv  4 Current           3 Preterm 2012   8 lb 12 oz (3.969 kg) M Vag-Spont   LIV  2 Preterm 2009   6 lb 11 oz (3.033 kg) F Vag-Spont   LIV  1 Preterm 2008   5 lb 6 oz (2.438 kg) F Vag-Spont   LIV     Last pap smear: No results found for: "DIAGPAP", "HPV", "HPVHIGH" None on file but reportedly abnormal in jail, will need repeat PP  Past Medical History:  Diagnosis Date   Anxiety    Depression    Heroin abuse (HCC)    Past Surgical History:  Procedure Laterality Date   NO PAST SURGERIES     History reviewed. No pertinent family history. Social History   Tobacco Use   Smoking status: Every Day    Packs/day: .5    Types: Cigarettes  Vaping Use   Vaping Use: Every day  Substance Use Topics   Alcohol use: No   Drug use: Yes    Types: Heroin, Methamphetamines    Comment: meth use 2 days ago per patient; heroin use 04/10/23   Allergies  Allergen Reactions   Clindamycin/Lincomycin Anaphylaxis   Ultram [Tramadol] Anaphylaxis   Hydrocodone-Acetaminophen Hives   Current Outpatient Medications on File Prior to Visit  Medication Sig Dispense Refill   cyclobenzaprine (FLEXERIL) 10 MG tablet Take 1 tablet (10 mg total) by mouth 3 (three) times daily as needed for muscle spasms. 30 tablet 1   Prenatal Vit-Fe Fumarate-FA (PRENATAL MULTIVITAMIN) TABS tablet Take 1 tablet by mouth daily.     methadone (DOLOPHINE) 10 MG tablet  Take 50 mg by mouth daily. (Patient not taking: Reported on 05/04/2023)     No current facility-administered medications on file prior to visit.     Exam   Vitals:   05/04/23 0958  BP: 110/73  Pulse: 91  Weight: 145 lb (65.8 kg)   Fetal Heart Rate (bpm): 125  System: General: well-developed, well-nourished female in no acute distress   Skin: normal coloration and turgor, no rashes   Neurologic: oriented, normal, negative, normal mood   Extremities: normal strength, tone, and muscle mass, ROM of all joints is normal   HEENT PERRLA, extraocular movement intact and sclera clear, anicteric   Neck supple and no masses   Respiratory:  no respiratory distress      Assessment:   Pregnancy: [id number] Patient Active Problem List   Diagnosis Date Noted   Supervision of high risk pregnancy, antepartum, third trimester 05/04/2023   UTI in pregnancy 04/19/2023   Anxiety 04/15/2023   Hepatitis C antibody test positive 04/11/2023   H/O abnormal cervical Papanicolaou smear 04/11/2023   Opioid use disorder 04/10/2023     Plan:  1. Supervision of high risk pregnancy, antepartum, third trimester  BP and FHR normal Labs reviewed and are mostly up to date Offered genetics, accepts Also discussed glucose testing for next visit, advised to be fasting Follow up growth [place] ordered Accepted into mom baby program (does not reside with her other children)  2. Opioid use disorder First met patient during admission for which she was started on methadone a few weeks ago, since then she has struggled with getting enrolled in the [place] clinic and staying consistent with methadone Today reports she not yet completed intake and has not been getting methadone daily Has been getting by using friends methadone and illicits, last used around 2000 Discussed she may have an opportunity to switch to suboxone but she is very hesitant and would prefer to stay on methadone for now She has an appt to finish an  intake and get dosed tomorrow morning that she is for sure going to make it to Will send her to MAU today to dose with 55 mg - ToxAssure Flex 15, Ur  3. Urinary tract infection in mother during third trimester of pregnancy E.coli during hospitalization a few weeks ago TOC today  4. Hepatitis C antibody test positive +Antibody, HCV RNA detected but below range of assay No FSE in labor Referral to GI or ID postpartum for treatment  5. H/O abnormal cervical Papanicolaou smear Will need PP pap  6. Anxiety Still an issue, having trouble sleeping Has been on lexapro, will resume at 10 mg and reassess at next visit Trial trazodone for sleep   Problem list reviewed and updated. The nature of [facility] clinic was explained to patient; Voiced they may need to be seen by other [facility] providers which includes family medicine physicians, OB GYNs, and APPs. Delivery will hopefully be with one of the [facility] providers or another [facility] physician and we cannot promise this at this time.  Discussed there are [facility] staff in the hospital 24-7 and they understand and support this model and there is a likelihood one of these providers will catch their baby.  We also discussed that the service includes learners (residents, student) and they will be involved in the care team.  Routine obstetric precautions reviewed. Return in 2 weeks (on 05/18/2023) for [facility], ob visit, OUD f/u.

## 2023-05-05 ENCOUNTER — Encounter: Payer: Self-pay | Admitting: *Deleted

## 2023-05-05 DIAGNOSIS — Z720 Tobacco use: Secondary | ICD-10-CM | POA: Insufficient documentation

## 2023-05-05 DIAGNOSIS — O99013 Anemia complicating pregnancy, third trimester: Secondary | ICD-10-CM | POA: Insufficient documentation

## 2023-05-05 LAB — CBC
Hematocrit: 30.8 % — ABNORMAL LOW (ref 34.0–46.6)
Hemoglobin: 10.3 g/dL — ABNORMAL LOW (ref 11.1–15.9)
MCH: 28.3 pg (ref 26.6–33.0)
MCHC: 33.4 g/dL (ref 31.5–35.7)
MCV: 85 fL (ref 79–97)
Platelets: 220 10*3/uL (ref 150–450)
RBC: 3.64 x10E6/uL — ABNORMAL LOW (ref 3.77–5.28)
RDW: 11.8 % (ref 11.7–15.4)
WBC: 8.2 10*3/uL (ref 3.4–10.8)

## 2023-05-05 LAB — HIV ANTIBODY (ROUTINE TESTING W REFLEX): HIV Screen 4th Generation wRfx: NONREACTIVE

## 2023-05-05 LAB — RPR: RPR Ser Ql: NONREACTIVE

## 2023-05-05 MED ORDER — FERROUS SULFATE 325 (65 FE) MG PO TBEC
325.0000 mg | DELAYED_RELEASE_TABLET | ORAL | 2 refills | Status: AC
Start: 2023-05-05 — End: 2023-11-01

## 2023-05-05 NOTE — Addendum Note (Signed)
Addended by: Merian Capron on: 05/05/2023 10:29 AM   Modules accepted: Orders

## 2023-05-06 ENCOUNTER — Telehealth: Payer: BLUE CROSS/BLUE SHIELD | Admitting: Emergency Medicine

## 2023-05-06 DIAGNOSIS — O26899 Other specified pregnancy related conditions, unspecified trimester: Secondary | ICD-10-CM

## 2023-05-06 LAB — URINE CULTURE, OB REFLEX

## 2023-05-06 LAB — CULTURE, OB URINE

## 2023-05-06 MED ORDER — FLUCONAZOLE 150 MG PO TABS: 150.0000 mg | ORAL_TABLET | Freq: Once | ORAL | 0 refills | Status: DC

## 2023-05-06 MED ORDER — FLUCONAZOLE 150 MG PO TABS: 150.0000 mg | ORAL_TABLET | Freq: Once | ORAL | 0 refills | Status: AC

## 2023-05-06 NOTE — Addendum Note (Signed)
Addended by: Isabell Jarvis on: 05/06/2023 04:49 PM   Modules accepted: Orders

## 2023-05-06 NOTE — Progress Notes (Signed)
For the safety of you and your child, I recommend a face to face office visit with your OBGYN or a health care provider.    After reviewing your E-Visit request, I recommend that you consult your OB/GYN or pediatrician for medical advice in relation to your condition and prescription medications while pregnant or breastfeeding.  NOTE:  There will be NO CHARGE for this eVisit  If you are having a true medical emergency please call 911.    For an urgent face to face visit, [facility] has six urgent care centers for your convenience:     [facility] Health Urgent Care Center at [facility] Directions [phone] [address] [place], [place] [address]    [facility] Health Urgent Care Center [facility]) Get Driving Directions [phone] [address] [place], [place] [address]  [facility] Health Urgent Care Center [facility]) Get Driving Directions [phone] [address] [place],  [place]  [address]  [facility] Health Urgent Care at [facility] Get Driving Directions [phone] [address] [place], [place] [address]   [facility] Health Urgent Care at [facility] Get Driving Directions  [phone] [address] [place], [place] [address]   [facility] Health Urgent Care at [facility] Directions [phone] [address] [place], [place] [address]  Your MyChart E-visit questionnaire answers were reviewed by a board certified advanced clinical practitioner to complete your personal care plan based on your specific symptoms.  Thank you for using e-Visits.  Approximately 5 minutes was used in reviewing the patient's chart, questionnaire, prescribing medications, and documentation.

## 2023-05-06 NOTE — Addendum Note (Signed)
Addended by: Isabell Jarvis on: 05/06/2023 04:56 PM   Modules accepted: Orders

## 2023-05-08 DIAGNOSIS — O4703 False labor before 37 completed weeks of gestation, third trimester: Secondary | ICD-10-CM | POA: Diagnosis not present

## 2023-05-08 DIAGNOSIS — Z3A30 30 weeks gestation of pregnancy: Secondary | ICD-10-CM | POA: Diagnosis not present

## 2023-05-09 LAB — TOXASSURE FLEX 15, UR
6-ACETYLMORPHINE IA: NEGATIVE ng/mL
7-aminoclonazepam: NOT DETECTED ng/mg creat
Alpha-hydroxyalprazolam: 70 ng/mg creat
Alpha-hydroxymidazolam: NOT DETECTED ng/mg creat
Alpha-hydroxytriazolam: NOT DETECTED ng/mg creat
Alprazolam: 84 ng/mg creat
BARBITURATES IA: NEGATIVE ng/mL
BUPRENORPHINE: NEGATIVE
Benzodiazepines: POSITIVE
Buprenorphine: NOT DETECTED ng/mg creat
CANNABINOIDS IA: NEGATIVE ng/mL
COCAINE METABOLITE IA: NEGATIVE ng/mL
Clonazepam: NOT DETECTED ng/mg creat
Creatinine: 127 mg/dL
Desalkylflurazepam: NOT DETECTED ng/mg creat
Desmethyldiazepam: NOT DETECTED ng/mg creat
Desmethylflunitrazepam: NOT DETECTED ng/mg creat
Diazepam: NOT DETECTED ng/mg creat
ETHYL ALCOHOL Enzymatic: NEGATIVE g/dL
FENTANYL: POSITIVE
Fentanyl: >394 ng/mg creat
Flunitrazepam: NOT DETECTED ng/mg creat
Lorazepam: NOT DETECTED ng/mg creat
METHADONE IA: NEGATIVE ng/mL
METHADONE MTB IA: NEGATIVE ng/mL
Midazolam: NOT DETECTED ng/mg creat
Norbuprenorphine: NOT DETECTED ng/mg creat
Norfentanyl: >787 ng/mg creat
OPIATE CLASS IA: NEGATIVE ng/mL
OXYCODONE CLASS IA: NEGATIVE ng/mL
Oxazepam: 28 ng/mg creat
PHENCYCLIDINE IA: NEGATIVE ng/mL
TAPENTADOL, IA: NEGATIVE ng/mL
TRAMADOL IA: NEGATIVE ng/mL
Temazepam: NOT DETECTED ng/mg creat

## 2023-05-09 LAB — AMPHETAMINES, MS, UR RFX
Amphetamine: 2814 ng/mg creat
Amphetamines Confirmation: POSITIVE
MDA (Ecstasy metabolite): NOT DETECTED ng/mg creat
MDMA (Ecstasy): NOT DETECTED ng/mg creat
Methamphetamine: >3937 ng/mg creat

## 2023-05-10 ENCOUNTER — Ambulatory Visit: Payer: BLUE CROSS/BLUE SHIELD | Attending: Family Medicine

## 2023-05-11 LAB — PANORAMA PRENATAL TEST FULL PANEL:PANORAMA TEST PLUS 5 ADDITIONAL MICRODELETIONS: FETAL FRACTION: 20.9

## 2023-05-13 LAB — HORIZON CUSTOM: REPORT SUMMARY: NEGATIVE

## 2023-05-14 DIAGNOSIS — O4703 False labor before 37 completed weeks of gestation, third trimester: Secondary | ICD-10-CM | POA: Diagnosis not present

## 2023-05-14 DIAGNOSIS — O23593 Infection of other part of genital tract in pregnancy, third trimester: Secondary | ICD-10-CM | POA: Diagnosis not present

## 2023-05-14 DIAGNOSIS — F1990 Other psychoactive substance use, unspecified, uncomplicated: Secondary | ICD-10-CM | POA: Diagnosis not present

## 2023-05-14 DIAGNOSIS — B9689 Other specified bacterial agents as the cause of diseases classified elsewhere: Secondary | ICD-10-CM | POA: Diagnosis not present

## 2023-05-14 DIAGNOSIS — Z79891 Long term (current) use of opiate analgesic: Secondary | ICD-10-CM | POA: Diagnosis not present

## 2023-05-14 DIAGNOSIS — F1920 Other psychoactive substance dependence, uncomplicated: Secondary | ICD-10-CM | POA: Diagnosis not present

## 2023-05-14 DIAGNOSIS — O99323 Drug use complicating pregnancy, third trimester: Secondary | ICD-10-CM | POA: Diagnosis not present

## 2023-05-14 DIAGNOSIS — O99333 Smoking (tobacco) complicating pregnancy, third trimester: Secondary | ICD-10-CM | POA: Diagnosis not present

## 2023-05-14 DIAGNOSIS — Z3A31 31 weeks gestation of pregnancy: Secondary | ICD-10-CM | POA: Diagnosis not present

## 2023-05-14 DIAGNOSIS — Z79899 Other long term (current) drug therapy: Secondary | ICD-10-CM | POA: Diagnosis not present

## 2023-05-14 DIAGNOSIS — F17210 Nicotine dependence, cigarettes, uncomplicated: Secondary | ICD-10-CM | POA: Diagnosis not present

## 2023-05-15 DIAGNOSIS — Z885 Allergy status to narcotic agent status: Secondary | ICD-10-CM | POA: Diagnosis not present

## 2023-05-15 DIAGNOSIS — O99333 Smoking (tobacco) complicating pregnancy, third trimester: Secondary | ICD-10-CM | POA: Diagnosis not present

## 2023-05-15 DIAGNOSIS — O99891 Other specified diseases and conditions complicating pregnancy: Secondary | ICD-10-CM | POA: Diagnosis not present

## 2023-05-15 DIAGNOSIS — O26893 Other specified pregnancy related conditions, third trimester: Secondary | ICD-10-CM | POA: Diagnosis not present

## 2023-05-15 DIAGNOSIS — Z79899 Other long term (current) drug therapy: Secondary | ICD-10-CM | POA: Diagnosis not present

## 2023-05-15 DIAGNOSIS — F17210 Nicotine dependence, cigarettes, uncomplicated: Secondary | ICD-10-CM | POA: Diagnosis not present

## 2023-05-15 DIAGNOSIS — R102 Pelvic and perineal pain: Secondary | ICD-10-CM | POA: Diagnosis not present

## 2023-05-15 DIAGNOSIS — Z3A31 31 weeks gestation of pregnancy: Secondary | ICD-10-CM | POA: Diagnosis not present

## 2023-05-15 DIAGNOSIS — Z888 Allergy status to other drugs, medicaments and biological substances status: Secondary | ICD-10-CM | POA: Diagnosis not present

## 2023-05-15 DIAGNOSIS — Z8759 Personal history of other complications of pregnancy, childbirth and the puerperium: Secondary | ICD-10-CM | POA: Diagnosis not present

## 2023-05-15 DIAGNOSIS — Z883 Allergy status to other anti-infective agents status: Secondary | ICD-10-CM | POA: Diagnosis not present

## 2023-05-20 ENCOUNTER — Encounter: Payer: Self-pay | Admitting: General Practice

## 2023-05-21 ENCOUNTER — Other Ambulatory Visit: Payer: BLUE CROSS/BLUE SHIELD

## 2023-05-21 ENCOUNTER — Encounter: Payer: BLUE CROSS/BLUE SHIELD | Admitting: Family Medicine

## 2023-05-21 DIAGNOSIS — O0993 Supervision of high risk pregnancy, unspecified, third trimester: Secondary | ICD-10-CM | POA: Diagnosis not present

## 2023-05-21 DIAGNOSIS — O368130 Decreased fetal movements, third trimester, not applicable or unspecified: Secondary | ICD-10-CM | POA: Diagnosis not present

## 2023-05-21 DIAGNOSIS — F1193 Opioid use, unspecified with withdrawal: Secondary | ICD-10-CM | POA: Diagnosis not present

## 2023-05-21 DIAGNOSIS — Z8759 Personal history of other complications of pregnancy, childbirth and the puerperium: Secondary | ICD-10-CM | POA: Diagnosis not present

## 2023-05-23 ENCOUNTER — Telehealth: Payer: Self-pay | Admitting: Pediatrics

## 2023-05-23 NOTE — Telephone Encounter (Signed)
NAS telephone consult attempted without answer, text message sent.  Upon chart review, it appears this patient is currently incarcerated.  Will follow-up 7/19.  She has our contact information if needs arise sooner.

## 2023-05-28 DIAGNOSIS — Z8759 Personal history of other complications of pregnancy, childbirth and the puerperium: Secondary | ICD-10-CM | POA: Diagnosis not present

## 2023-05-28 DIAGNOSIS — O0993 Supervision of high risk pregnancy, unspecified, third trimester: Secondary | ICD-10-CM | POA: Diagnosis not present

## 2023-05-31 DIAGNOSIS — Z5181 Encounter for therapeutic drug level monitoring: Secondary | ICD-10-CM | POA: Diagnosis not present

## 2023-06-02 DIAGNOSIS — F1120 Opioid dependence, uncomplicated: Secondary | ICD-10-CM | POA: Diagnosis not present

## 2023-06-02 DIAGNOSIS — B1910 Unspecified viral hepatitis B without hepatic coma: Secondary | ICD-10-CM | POA: Diagnosis not present

## 2023-06-02 DIAGNOSIS — O163 Unspecified maternal hypertension, third trimester: Secondary | ICD-10-CM | POA: Diagnosis not present

## 2023-06-02 DIAGNOSIS — F419 Anxiety disorder, unspecified: Secondary | ICD-10-CM | POA: Diagnosis not present

## 2023-06-02 DIAGNOSIS — R102 Pelvic and perineal pain: Secondary | ICD-10-CM | POA: Diagnosis not present

## 2023-06-02 DIAGNOSIS — F17210 Nicotine dependence, cigarettes, uncomplicated: Secondary | ICD-10-CM | POA: Diagnosis not present

## 2023-06-02 DIAGNOSIS — D649 Anemia, unspecified: Secondary | ICD-10-CM | POA: Diagnosis not present

## 2023-06-02 DIAGNOSIS — Z0184 Encounter for antibody response examination: Secondary | ICD-10-CM | POA: Diagnosis not present

## 2023-06-02 DIAGNOSIS — R319 Hematuria, unspecified: Secondary | ICD-10-CM | POA: Diagnosis not present

## 2023-06-02 DIAGNOSIS — O26893 Other specified pregnancy related conditions, third trimester: Secondary | ICD-10-CM | POA: Diagnosis not present

## 2023-06-02 DIAGNOSIS — O99343 Other mental disorders complicating pregnancy, third trimester: Secondary | ICD-10-CM | POA: Diagnosis not present

## 2023-06-02 DIAGNOSIS — Z3A35 35 weeks gestation of pregnancy: Secondary | ICD-10-CM | POA: Diagnosis not present

## 2023-06-02 DIAGNOSIS — Z113 Encounter for screening for infections with a predominantly sexual mode of transmission: Secondary | ICD-10-CM | POA: Diagnosis not present

## 2023-06-02 DIAGNOSIS — O98313 Other infections with a predominantly sexual mode of transmission complicating pregnancy, third trimester: Secondary | ICD-10-CM | POA: Diagnosis not present

## 2023-06-02 DIAGNOSIS — O99013 Anemia complicating pregnancy, third trimester: Secondary | ICD-10-CM | POA: Diagnosis not present

## 2023-06-02 DIAGNOSIS — R030 Elevated blood-pressure reading, without diagnosis of hypertension: Secondary | ICD-10-CM | POA: Diagnosis not present

## 2023-06-02 DIAGNOSIS — R7401 Elevation of levels of liver transaminase levels: Secondary | ICD-10-CM | POA: Diagnosis not present

## 2023-06-02 DIAGNOSIS — O98413 Viral hepatitis complicating pregnancy, third trimester: Secondary | ICD-10-CM | POA: Diagnosis not present

## 2023-06-02 DIAGNOSIS — O99323 Drug use complicating pregnancy, third trimester: Secondary | ICD-10-CM | POA: Diagnosis not present

## 2023-06-02 DIAGNOSIS — Z362 Encounter for other antenatal screening follow-up: Secondary | ICD-10-CM | POA: Diagnosis not present

## 2023-06-02 DIAGNOSIS — O99333 Smoking (tobacco) complicating pregnancy, third trimester: Secondary | ICD-10-CM | POA: Diagnosis not present

## 2023-06-03 DIAGNOSIS — D649 Anemia, unspecified: Secondary | ICD-10-CM | POA: Diagnosis not present

## 2023-06-03 DIAGNOSIS — B1910 Unspecified viral hepatitis B without hepatic coma: Secondary | ICD-10-CM | POA: Diagnosis not present

## 2023-06-03 DIAGNOSIS — F1120 Opioid dependence, uncomplicated: Secondary | ICD-10-CM | POA: Diagnosis not present

## 2023-06-03 DIAGNOSIS — O163 Unspecified maternal hypertension, third trimester: Secondary | ICD-10-CM | POA: Diagnosis not present

## 2023-06-03 DIAGNOSIS — O99013 Anemia complicating pregnancy, third trimester: Secondary | ICD-10-CM | POA: Diagnosis not present

## 2023-06-03 DIAGNOSIS — F419 Anxiety disorder, unspecified: Secondary | ICD-10-CM | POA: Diagnosis not present

## 2023-06-03 DIAGNOSIS — O98413 Viral hepatitis complicating pregnancy, third trimester: Secondary | ICD-10-CM | POA: Diagnosis not present

## 2023-06-03 DIAGNOSIS — O99333 Smoking (tobacco) complicating pregnancy, third trimester: Secondary | ICD-10-CM | POA: Diagnosis not present

## 2023-06-03 DIAGNOSIS — F17210 Nicotine dependence, cigarettes, uncomplicated: Secondary | ICD-10-CM | POA: Diagnosis not present

## 2023-06-03 DIAGNOSIS — R7401 Elevation of levels of liver transaminase levels: Secondary | ICD-10-CM | POA: Diagnosis not present

## 2023-06-03 DIAGNOSIS — Z113 Encounter for screening for infections with a predominantly sexual mode of transmission: Secondary | ICD-10-CM | POA: Diagnosis not present

## 2023-06-03 DIAGNOSIS — R030 Elevated blood-pressure reading, without diagnosis of hypertension: Secondary | ICD-10-CM | POA: Diagnosis not present

## 2023-06-03 DIAGNOSIS — O99343 Other mental disorders complicating pregnancy, third trimester: Secondary | ICD-10-CM | POA: Diagnosis not present

## 2023-06-03 DIAGNOSIS — O26893 Other specified pregnancy related conditions, third trimester: Secondary | ICD-10-CM | POA: Diagnosis not present

## 2023-06-03 DIAGNOSIS — O98313 Other infections with a predominantly sexual mode of transmission complicating pregnancy, third trimester: Secondary | ICD-10-CM | POA: Diagnosis not present

## 2023-06-03 DIAGNOSIS — O99323 Drug use complicating pregnancy, third trimester: Secondary | ICD-10-CM | POA: Diagnosis not present

## 2023-06-03 DIAGNOSIS — Z0184 Encounter for antibody response examination: Secondary | ICD-10-CM | POA: Diagnosis not present

## 2023-06-03 DIAGNOSIS — R102 Pelvic and perineal pain: Secondary | ICD-10-CM | POA: Diagnosis not present

## 2023-06-07 DIAGNOSIS — Z3A36 36 weeks gestation of pregnancy: Secondary | ICD-10-CM | POA: Diagnosis not present

## 2023-06-07 DIAGNOSIS — R109 Unspecified abdominal pain: Secondary | ICD-10-CM | POA: Diagnosis not present

## 2023-06-07 DIAGNOSIS — F1110 Opioid abuse, uncomplicated: Secondary | ICD-10-CM | POA: Diagnosis not present

## 2023-06-07 NOTE — Telephone Encounter (Signed)
Hello, does anyone know if a fax has been received requesting records on behalf of this patient?

## 2023-06-09 DIAGNOSIS — O368130 Decreased fetal movements, third trimester, not applicable or unspecified: Secondary | ICD-10-CM | POA: Diagnosis not present

## 2023-06-09 DIAGNOSIS — R7401 Elevation of levels of liver transaminase levels: Secondary | ICD-10-CM | POA: Diagnosis not present

## 2023-06-09 DIAGNOSIS — Z113 Encounter for screening for infections with a predominantly sexual mode of transmission: Secondary | ICD-10-CM | POA: Diagnosis not present

## 2023-06-09 DIAGNOSIS — Z0184 Encounter for antibody response examination: Secondary | ICD-10-CM | POA: Diagnosis not present

## 2023-06-09 DIAGNOSIS — F1120 Opioid dependence, uncomplicated: Secondary | ICD-10-CM | POA: Diagnosis not present

## 2023-06-09 DIAGNOSIS — B1910 Unspecified viral hepatitis B without hepatic coma: Secondary | ICD-10-CM | POA: Diagnosis not present

## 2023-06-09 DIAGNOSIS — F17210 Nicotine dependence, cigarettes, uncomplicated: Secondary | ICD-10-CM | POA: Diagnosis not present

## 2023-06-09 DIAGNOSIS — O98313 Other infections with a predominantly sexual mode of transmission complicating pregnancy, third trimester: Secondary | ICD-10-CM | POA: Diagnosis not present

## 2023-06-09 DIAGNOSIS — O99333 Smoking (tobacco) complicating pregnancy, third trimester: Secondary | ICD-10-CM | POA: Diagnosis not present

## 2023-06-09 DIAGNOSIS — O09893 Supervision of other high risk pregnancies, third trimester: Secondary | ICD-10-CM | POA: Diagnosis not present

## 2023-06-09 DIAGNOSIS — O99323 Drug use complicating pregnancy, third trimester: Secondary | ICD-10-CM | POA: Diagnosis not present

## 2023-06-09 DIAGNOSIS — D649 Anemia, unspecified: Secondary | ICD-10-CM | POA: Diagnosis not present

## 2023-06-09 DIAGNOSIS — R030 Elevated blood-pressure reading, without diagnosis of hypertension: Secondary | ICD-10-CM | POA: Diagnosis not present

## 2023-06-09 DIAGNOSIS — F419 Anxiety disorder, unspecified: Secondary | ICD-10-CM | POA: Diagnosis not present

## 2023-06-09 DIAGNOSIS — O99013 Anemia complicating pregnancy, third trimester: Secondary | ICD-10-CM | POA: Diagnosis not present

## 2023-06-09 DIAGNOSIS — O26893 Other specified pregnancy related conditions, third trimester: Secondary | ICD-10-CM | POA: Diagnosis not present

## 2023-06-09 DIAGNOSIS — O99343 Other mental disorders complicating pregnancy, third trimester: Secondary | ICD-10-CM | POA: Diagnosis not present

## 2023-06-09 DIAGNOSIS — O98413 Viral hepatitis complicating pregnancy, third trimester: Secondary | ICD-10-CM | POA: Diagnosis not present

## 2023-06-13 DIAGNOSIS — O479 False labor, unspecified: Secondary | ICD-10-CM | POA: Diagnosis not present

## 2023-06-14 ENCOUNTER — Telehealth: Payer: Self-pay | Admitting: Pediatrics

## 2023-06-14 DIAGNOSIS — Z3A36 36 weeks gestation of pregnancy: Secondary | ICD-10-CM | POA: Diagnosis not present

## 2023-06-14 DIAGNOSIS — O99333 Smoking (tobacco) complicating pregnancy, third trimester: Secondary | ICD-10-CM | POA: Diagnosis not present

## 2023-06-14 DIAGNOSIS — O4703 False labor before 37 completed weeks of gestation, third trimester: Secondary | ICD-10-CM | POA: Diagnosis not present

## 2023-06-14 DIAGNOSIS — Z3A35 35 weeks gestation of pregnancy: Secondary | ICD-10-CM | POA: Diagnosis not present

## 2023-06-14 DIAGNOSIS — O99013 Anemia complicating pregnancy, third trimester: Secondary | ICD-10-CM | POA: Diagnosis not present

## 2023-06-14 DIAGNOSIS — Z79899 Other long term (current) drug therapy: Secondary | ICD-10-CM | POA: Diagnosis not present

## 2023-06-14 DIAGNOSIS — O26893 Other specified pregnancy related conditions, third trimester: Secondary | ICD-10-CM | POA: Diagnosis not present

## 2023-06-14 DIAGNOSIS — O99891 Other specified diseases and conditions complicating pregnancy: Secondary | ICD-10-CM | POA: Diagnosis not present

## 2023-06-14 DIAGNOSIS — R109 Unspecified abdominal pain: Secondary | ICD-10-CM | POA: Diagnosis not present

## 2023-06-14 DIAGNOSIS — Z3689 Encounter for other specified antenatal screening: Secondary | ICD-10-CM | POA: Diagnosis not present

## 2023-06-14 DIAGNOSIS — F17210 Nicotine dependence, cigarettes, uncomplicated: Secondary | ICD-10-CM | POA: Diagnosis not present

## 2023-06-14 DIAGNOSIS — O42913 Preterm premature rupture of membranes, unspecified as to length of time between rupture and onset of labor, third trimester: Secondary | ICD-10-CM | POA: Diagnosis not present

## 2023-06-14 NOTE — Telephone Encounter (Signed)
NAS telephone consult complete.  Mom states she is currently receiving treatment at [facility] in [place] as part of her bond agreement following 4 weeks in jail in [place] due to failure to appear.  She describes s/s of preterm labor for which she was seen at [facility] today (contractions every 3 min, dilated 1.5 cm and leaking fluid) and discharged back to [facility]. Transitioned from methadone to subutex while in jail and now to suboxone for the last 2 weeks at [facility].  She desires treatment facility closer to [facility] for a [facility] delivery.  She has NAS consult number.  Will follow with her later this week.

## 2023-06-17 DIAGNOSIS — Z3A36 36 weeks gestation of pregnancy: Secondary | ICD-10-CM | POA: Diagnosis not present

## 2023-06-17 DIAGNOSIS — Z3685 Encounter for antenatal screening for Streptococcus B: Secondary | ICD-10-CM | POA: Diagnosis not present

## 2023-06-17 DIAGNOSIS — O0993 Supervision of high risk pregnancy, unspecified, third trimester: Secondary | ICD-10-CM | POA: Diagnosis not present

## 2023-06-17 DIAGNOSIS — Z113 Encounter for screening for infections with a predominantly sexual mode of transmission: Secondary | ICD-10-CM | POA: Diagnosis not present

## 2023-06-24 DIAGNOSIS — O0993 Supervision of high risk pregnancy, unspecified, third trimester: Secondary | ICD-10-CM | POA: Diagnosis not present

## 2023-07-14 ENCOUNTER — Inpatient Hospital Stay (HOSPITAL_COMMUNITY): Admission: AD | Admit: 2023-07-14 | Payer: BLUE CROSS/BLUE SHIELD | Source: Home / Self Care

## 2023-08-20 ENCOUNTER — Telehealth: Payer: Self-pay | Admitting: Pediatrics

## 2023-08-20 NOTE — Telephone Encounter (Signed)
NAS postpartum consult attempted without answer.  HIPAA compliant message left requesting a return call or text. Will follow again on 10/18.

## 2024-11-06 ENCOUNTER — Other Ambulatory Visit: Payer: Self-pay | Admitting: Medical Genetics
